# Patient Record
Sex: Female | Born: 1990 | Race: White | Hispanic: No | Marital: Married | Smoking: Former smoker
Health system: Southern US, Community
[De-identification: ages and names within clinical notes are randomized; demographics above are authoritative.]

## PROBLEM LIST (undated history)

## (undated) DIAGNOSIS — E7800 Pure hypercholesterolemia, unspecified: Secondary | ICD-10-CM

## (undated) DIAGNOSIS — I10 Essential (primary) hypertension: Secondary | ICD-10-CM

## (undated) DIAGNOSIS — C55 Malignant neoplasm of uterus, part unspecified: Secondary | ICD-10-CM

## (undated) DIAGNOSIS — F840 Autistic disorder: Secondary | ICD-10-CM

## (undated) DIAGNOSIS — E119 Type 2 diabetes mellitus without complications: Secondary | ICD-10-CM

## (undated) HISTORY — PX: ABDOMINAL HYSTERECTOMY: SHX81

---

## 2012-04-02 ENCOUNTER — Emergency Department (HOSPITAL_BASED_OUTPATIENT_CLINIC_OR_DEPARTMENT_OTHER)
Admission: EM | Admit: 2012-04-02 | Discharge: 2012-04-03 | Disposition: A | Discharge status: Home / Self Care | Payer: Self-pay | Attending: Emergency Medicine | Admitting: Emergency Medicine

## 2012-04-02 ENCOUNTER — Encounter (HOSPITAL_BASED_OUTPATIENT_CLINIC_OR_DEPARTMENT_OTHER): Payer: Self-pay | Admitting: *Deleted

## 2012-04-02 DIAGNOSIS — R52 Pain, unspecified: Secondary | ICD-10-CM | POA: Insufficient documentation

## 2012-04-02 DIAGNOSIS — R0602 Shortness of breath: Secondary | ICD-10-CM | POA: Insufficient documentation

## 2012-04-02 DIAGNOSIS — J3489 Other specified disorders of nose and nasal sinuses: Secondary | ICD-10-CM | POA: Insufficient documentation

## 2012-04-02 DIAGNOSIS — I10 Essential (primary) hypertension: Secondary | ICD-10-CM | POA: Insufficient documentation

## 2012-04-02 DIAGNOSIS — R062 Wheezing: Secondary | ICD-10-CM | POA: Insufficient documentation

## 2012-04-02 DIAGNOSIS — J40 Bronchitis, not specified as acute or chronic: Secondary | ICD-10-CM | POA: Insufficient documentation

## 2012-04-02 DIAGNOSIS — Z79899 Other long term (current) drug therapy: Secondary | ICD-10-CM | POA: Insufficient documentation

## 2012-04-02 DIAGNOSIS — IMO0001 Reserved for inherently not codable concepts without codable children: Secondary | ICD-10-CM | POA: Insufficient documentation

## 2012-04-02 HISTORY — DX: Essential (primary) hypertension: I10

## 2012-04-02 MED ORDER — ALBUTEROL SULFATE HFA 108 (90 BASE) MCG/ACT IN AERS
2.0000 | INHALATION_SPRAY | RESPIRATORY_TRACT | Status: DC | PRN
  Administered 2012-04-03: 2 via RESPIRATORY_TRACT
  Filled 2012-04-02: qty 6.7

## 2012-04-02 NOTE — ED Notes (Signed)
Flu like symptoms. Coughing, sneezing, congestion, aching

## 2012-04-02 NOTE — ED Provider Notes (Signed)
History     CSN: [id number]  Arrival date & time 04/02/12  2136   First MD Initiated Contact with Patient 04/02/12 2341      Chief Complaint  Patient presents with  . Influenza    (Consider location/radiation/quality/duration/timing/severity/associated sxs/prior treatment) HPI This is a 22 year old patient with a one-week history of flulike symptoms. Specifically she complains of cough, shortness of breath, wheezing, nasal congestion, body aches and malaise. This person is here this evening due to persistence of symptoms. There is no current fever. Although this person is used an inhaler in the past now has been available for treatment during this illness.  Past Medical History  Diagnosis Date  . Hypertension     History reviewed. No pertinent past surgical history.  No family history on file.  History  Substance Use Topics  . Smoking status: Never Smoker   . Smokeless tobacco: Not on file  . Alcohol Use: No      Review of Systems  All other systems reviewed and are negative.    Allergies  Review of patient's allergies indicates no known allergies.  Home Medications   Current Outpatient Rx  Name  Route  Sig  Dispense  Refill  . ARIPiprazole (ABILIFY) 10 MG tablet   Oral   Take 10 mg by mouth daily.         . citalopram (CELEXA) 20 MG tablet   Oral   Take 20 mg by mouth daily.         . enalapril (VASOTEC) 5 MG tablet   Oral   Take 5 mg by mouth daily.           BP 157/86  Pulse 105  Temp(Src) 98 F (36.7 C) (Oral)  Resp 20  Ht 5\' 2"  (1.575 m)  Wt 290 lb (131.543 kg)  BMI 53.03 kg/m2  SpO2 98%  Physical Exam General: Well-developed, obese patient in no acute distress; appearance consistent with age of record HENT: normocephalic, atraumatic; no pharyngeal erythema or exudate Eyes: pupils equal round and reactive to light; extraocular muscles intact Neck: supple Heart: regular rate and rhythm Lungs: clear to auscultation  bilaterally Abdomen: soft; obese; bowel sounds present Extremities: No deformity; full range of motion Neurologic: Awake, alert and oriented; motor function intact in all extremities and symmetric; no facial droop Skin: Warm and dry Psychiatric: Normal mood and affect    ED Course  Procedures (including critical care time)     MDM  Nursing notes and vitals signs, including pulse oximetry, reviewed.  Summary of this visit's results, reviewed by myself:  Imaging Studies: Dg Chest 2 View  04/03/2012  *RADIOLOGY REPORT*  Clinical Data: Cough, congestion  CHEST - 2 VIEW  Comparison: None.  Findings: Lungs are clear. No pleural effusion or pneumothorax. The cardiomediastinal contours are within normal limits. The visualized bones and soft tissues are without significant appreciable abnormality.  IMPRESSION: No radiographic evidence of acute cardiopulmonary process.   Original Report Authenticated By: Jearld Lesch, M.D.             Hanley Seamen, MD 04/03/12 [phone]

## 2012-04-03 ENCOUNTER — Emergency Department (HOSPITAL_BASED_OUTPATIENT_CLINIC_OR_DEPARTMENT_OTHER): Payer: Self-pay

## 2012-04-03 MED ORDER — ONDANSETRON 8 MG PO TBDP
8.0000 mg | ORAL_TABLET | Freq: Once | ORAL | Status: AC
  Administered 2012-04-03: 8 mg via ORAL
  Filled 2012-04-03: qty 1

## 2012-04-03 MED ORDER — HYDROCOD POLST-CHLORPHEN POLST 10-8 MG/5ML PO LQCR: 5.0000 mL | Freq: Two times a day (BID) | ORAL | Status: DC | PRN

## 2012-04-03 NOTE — ED Notes (Signed)
Patient transported to X-ray 

## 2012-04-03 NOTE — Patient Instructions (Signed)
Instructed pt on the proper use of administering albuteral mdi via aerochamber pt tolerated well 

## 2012-04-18 ENCOUNTER — Encounter (HOSPITAL_BASED_OUTPATIENT_CLINIC_OR_DEPARTMENT_OTHER): Payer: Self-pay | Admitting: Emergency Medicine

## 2012-04-18 ENCOUNTER — Emergency Department (HOSPITAL_BASED_OUTPATIENT_CLINIC_OR_DEPARTMENT_OTHER): Payer: Self-pay

## 2012-04-18 ENCOUNTER — Emergency Department (HOSPITAL_BASED_OUTPATIENT_CLINIC_OR_DEPARTMENT_OTHER)
Admission: EM | Admit: 2012-04-18 | Discharge: 2012-04-18 | Disposition: A | Discharge status: Home / Self Care | Payer: Self-pay | Attending: Emergency Medicine | Admitting: Emergency Medicine

## 2012-04-18 DIAGNOSIS — I10 Essential (primary) hypertension: Secondary | ICD-10-CM | POA: Insufficient documentation

## 2012-04-18 DIAGNOSIS — R0789 Other chest pain: Secondary | ICD-10-CM | POA: Insufficient documentation

## 2012-04-18 DIAGNOSIS — R42 Dizziness and giddiness: Secondary | ICD-10-CM | POA: Insufficient documentation

## 2012-04-18 DIAGNOSIS — Z79899 Other long term (current) drug therapy: Secondary | ICD-10-CM | POA: Insufficient documentation

## 2012-04-18 NOTE — ED Provider Notes (Signed)
History     CSN: [id number]  Arrival date & time 04/18/12  0258   First MD Initiated Contact with Patient 04/18/12 0310      Chief Complaint  Patient presents with  . Dizziness  . Chest Pain    (Consider location/radiation/quality/duration/timing/severity/associated sxs/prior treatment) HPI Comments: Patient is biologically a female who requests to be referred to as a female.  Presents tonight with a several hour history of feeling dizzy, having chest pains.  The significant other in the room with the patient reports that the blood sugar was 105 when it is "normally 70 this time of night."  Patient has no history of diabetes.  No fevers or chills.  No cough.    Patient is a 22 y.o. female presenting with chest pain. The history is provided by the patient.  Chest Pain   Past Medical History  Diagnosis Date  . Hypertension     History reviewed. No pertinent past surgical history.  No family history on file.  History  Substance Use Topics  . Smoking status: Never Smoker   . Smokeless tobacco: Not on file  . Alcohol Use: No      Review of Systems  Cardiovascular: Positive for chest pain.  All other systems reviewed and are negative.    Allergies  Review of patient's allergies indicates no known allergies.  Home Medications   Current Outpatient Rx  Name  Route  Sig  Dispense  Refill  . albuterol (PROVENTIL HFA;VENTOLIN HFA) 108 (90 BASE) MCG/ACT inhaler   Inhalation   Inhale 2 puffs into the lungs every 6 (six) hours as needed for wheezing.         . naproxen (NAPROSYN) 500 MG tablet   Oral   Take 500 mg by mouth as needed.         . ARIPiprazole (ABILIFY) 10 MG tablet   Oral   Take 10 mg by mouth daily.         . chlorpheniramine-HYDROcodone (TUSSIONEX PENNKINETIC ER) 10-8 MG/5ML LQCR   Oral   Take 5 mLs by mouth every 12 (twelve) hours as needed (for cough).   115 mL   0   . citalopram (CELEXA) 20 MG tablet   Oral   Take 20 mg by mouth daily.        . enalapril (VASOTEC) 5 MG tablet   Oral   Take 5 mg by mouth daily.           BP 154/80  Pulse 85  Temp(Src) 97.8 F (36.6 C) (Oral)  Resp 18  Ht 5\' 2"  (1.575 m)  Wt 290 lb (131.543 kg)  BMI 53.03 kg/m2  SpO2 99%  Physical Exam  Nursing note and vitals reviewed. Constitutional: He appears well-developed and well-nourished. No distress.  Awake, alert, nontoxic appearance.  HENT:  Head: Normocephalic and atraumatic.  Mouth/Throat: Oropharynx is clear and moist.  Eyes: Pupils are equal, round, and reactive to light.  Neck: Normal range of motion. Neck supple.  Cardiovascular: Normal rate and regular rhythm.   No murmur heard. Pulmonary/Chest: Effort normal and breath sounds normal. No respiratory distress. He has no wheezes.  Abdominal: Soft. Bowel sounds are normal. There is no tenderness. There is no rebound.  Musculoskeletal: Normal range of motion. He exhibits no tenderness.  Baseline ROM, no obvious new focal weakness.  Lymphadenopathy:    He has no cervical adenopathy.  Neurological: He is alert.  Mental status and motor strength appears baseline for patient and situation.  Skin: Skin is warm and dry. No rash noted. He is not diaphoretic.  Psychiatric: He has a normal mood and affect.    ED Course  Procedures (including critical care time)  Labs Reviewed  CBC WITH DIFFERENTIAL  BASIC METABOLIC PANEL   No results found.   No diagnosis found.   Date: 04/18/2012  Rate: 87  Rhythm: normal sinus rhythm  QRS Axis: normal  Intervals: normal  ST/T Wave abnormalities: normal  Conduction Disutrbances:none  Narrative Interpretation:   Old EKG Reviewed: none available    MDM  The labs are unremarkable with the exception of an elevated wbc of 15k, the significance of which I am unsure.  I suspect this is likely a viral illness as there are no specific complaints except for chest discomfort.  The ekg is unremarkable and no further workup seems  appropriate at this time.  I have recommend watching the blood sugar at home.  Follow up with pcp if not improving, return prn.        Geoffery Lyons, MD 04/18/12 0430

## 2012-04-18 NOTE — ED Notes (Signed)
Pt c/o fever, dizziness, chest pain. Pt had URI x 1-2 weeks ago.

## 2012-04-18 NOTE — ED Notes (Signed)
Pt has not used inhaler tonight.

## 2012-04-18 NOTE — ED Notes (Signed)
Patient transported to X-ray 

## 2012-04-18 NOTE — ED Notes (Signed)
MD at bedside. 

## 2012-04-23 ENCOUNTER — Encounter (HOSPITAL_BASED_OUTPATIENT_CLINIC_OR_DEPARTMENT_OTHER): Payer: Self-pay | Admitting: *Deleted

## 2012-04-23 ENCOUNTER — Emergency Department (HOSPITAL_BASED_OUTPATIENT_CLINIC_OR_DEPARTMENT_OTHER)
Admission: EM | Admit: 2012-04-23 | Discharge: 2012-04-23 | Disposition: A | Discharge status: Home / Self Care | Payer: Self-pay | Attending: Emergency Medicine | Admitting: Emergency Medicine

## 2012-04-23 DIAGNOSIS — I10 Essential (primary) hypertension: Secondary | ICD-10-CM | POA: Insufficient documentation

## 2012-04-23 DIAGNOSIS — N644 Mastodynia: Secondary | ICD-10-CM

## 2012-04-23 DIAGNOSIS — Z79899 Other long term (current) drug therapy: Secondary | ICD-10-CM | POA: Insufficient documentation

## 2012-04-23 LAB — CBC WITH DIFFERENTIAL/PLATELET
Basophils Absolute: 0 10*3/uL (ref 0.0–0.1)
Basophils Relative: 0 % (ref 0–1)
Eosinophils Absolute: 0.2 10*3/uL (ref 0.0–0.7)
MCH: 30.8 pg (ref 26.0–34.0)
MCHC: 33.4 g/dL (ref 30.0–36.0)
Monocytes Absolute: 1.1 10*3/uL — ABNORMAL HIGH (ref 0.1–1.0)
Monocytes Relative: 7 % (ref 3–12)
Neutro Abs: 10.9 10*3/uL — ABNORMAL HIGH (ref 1.7–7.7)
Neutrophils Relative %: 71 % (ref 43–77)
RDW: 12.8 % (ref 11.5–15.5)

## 2012-04-23 LAB — BASIC METABOLIC PANEL
BUN: 15 mg/dL (ref 6–23)
Chloride: 105 mEq/L (ref 96–112)
Creatinine, Ser: 0.7 mg/dL (ref 0.50–1.10)
GFR calc Af Amer: >90 mL/min (ref >90)
GFR calc non Af Amer: >90 mL/min (ref >90)
Potassium: 3.9 mEq/L (ref 3.5–5.1)

## 2012-04-23 NOTE — ED Provider Notes (Signed)
Medical screening examination/treatment/procedure(s) were performed by non-physician practitioner and as supervising physician I was immediately available for consultation/collaboration.  Ethelda Chick, MD 04/23/12 2155

## 2012-04-23 NOTE — ED Provider Notes (Signed)
History     CSN: [id number]  Arrival date & time 04/23/12  2033   First MD Initiated Contact with Patient 04/23/12 2125      Chief Complaint  Patient presents with  . Breast Problem    (Consider location/radiation/quality/duration/timing/severity/associated sxs/prior treatment) HPI Comments: Pt complains of a swollen tender area left breast.  Pt reports she has family members who have breast cancer and is worried. No fever, no chills,  No redness    No other problems.  Taking naproxen for pain  The history is provided by the patient. No language interpreter was used.    Past Medical History  Diagnosis Date  . Hypertension     History reviewed. No pertinent past surgical history.  History reviewed. No pertinent family history.  History  Substance Use Topics  . Smoking status: Never Smoker   . Smokeless tobacco: Not on file  . Alcohol Use: No    OB History   Grav Para Term Preterm Abortions TAB SAB Ect Mult Living                  Review of Systems  All other systems reviewed and are negative.    Allergies  Review of patient's allergies indicates no known allergies.  Home Medications   Current Outpatient Rx  Name  Route  Sig  Dispense  Refill  . albuterol (PROVENTIL HFA;VENTOLIN HFA) 108 (90 BASE) MCG/ACT inhaler   Inhalation   Inhale 2 puffs into the lungs every 6 (six) hours as needed for wheezing.         . ARIPiprazole (ABILIFY) 10 MG tablet   Oral   Take 10 mg by mouth daily.         . chlorpheniramine-HYDROcodone (TUSSIONEX PENNKINETIC ER) 10-8 MG/5ML LQCR   Oral   Take 5 mLs by mouth every 12 (twelve) hours as needed (for cough).   115 mL   0   . citalopram (CELEXA) 20 MG tablet   Oral   Take 20 mg by mouth daily.         . enalapril (VASOTEC) 5 MG tablet   Oral   Take 5 mg by mouth daily.         . naproxen (NAPROSYN) 500 MG tablet   Oral   Take 500 mg by mouth as needed.           BP 143/67  Pulse 102  Temp(Src) 98 F  (36.7 C) (Oral)  Resp 20  Ht 5\' 2"  (1.575 m)  Wt 290 lb (131.543 kg)  BMI 53.03 kg/m2  SpO2 98%  Physical Exam  Nursing note and vitals reviewed. Constitutional: She appears well-developed and well-nourished.  HENT:  Head: Normocephalic.  Cardiovascular: Normal rate and regular rhythm.   Pulmonary/Chest: Effort normal. She exhibits tenderness.  Tender areas left breast,  I do not feel a mass but there is a tender area with increased fullness  Musculoskeletal: Normal range of motion.  Neurological: She is alert.  Skin: Skin is warm.  Psychiatric: She has a normal mood and affect.    ED Course  Procedures (including critical care time)  Labs Reviewed - No data to display No results found.   1. Breast pain       MDM  Breast center phone number given and adult care at Urgent care number        Elson Areas, PA-C 04/23/12 2154

## 2012-04-23 NOTE — ED Notes (Signed)
Pt c/o left breast nodule x 1 week

## 2012-04-24 ENCOUNTER — Encounter (HOSPITAL_COMMUNITY): Payer: Self-pay | Admitting: Emergency Medicine

## 2012-04-24 ENCOUNTER — Emergency Department (HOSPITAL_COMMUNITY)
Admission: EM | Admit: 2012-04-24 | Discharge: 2012-04-24 | Disposition: A | Discharge status: Home / Self Care | Payer: Self-pay | Attending: Emergency Medicine | Admitting: Emergency Medicine

## 2012-04-24 DIAGNOSIS — I10 Essential (primary) hypertension: Secondary | ICD-10-CM | POA: Insufficient documentation

## 2012-04-24 DIAGNOSIS — R509 Fever, unspecified: Secondary | ICD-10-CM | POA: Insufficient documentation

## 2012-04-24 DIAGNOSIS — E669 Obesity, unspecified: Secondary | ICD-10-CM | POA: Insufficient documentation

## 2012-04-24 DIAGNOSIS — N644 Mastodynia: Secondary | ICD-10-CM | POA: Insufficient documentation

## 2012-04-24 DIAGNOSIS — Z79899 Other long term (current) drug therapy: Secondary | ICD-10-CM | POA: Insufficient documentation

## 2012-04-24 DIAGNOSIS — N63 Unspecified lump in unspecified breast: Secondary | ICD-10-CM | POA: Insufficient documentation

## 2012-04-24 MED ORDER — TRAMADOL HCL 50 MG PO TABS: 50.0000 mg | ORAL_TABLET | Freq: Four times a day (QID) | ORAL | Status: DC | PRN

## 2012-04-24 MED ORDER — ONDANSETRON 8 MG PO TBDP
8.0000 mg | ORAL_TABLET | Freq: Once | ORAL | Status: AC
  Administered 2012-04-24: 8 mg via ORAL
  Filled 2012-04-24: qty 1

## 2012-04-24 MED ORDER — HYDROCODONE-ACETAMINOPHEN 5-325 MG PO TABS
2.0000 | ORAL_TABLET | Freq: Once | ORAL | Status: AC
  Administered 2012-04-24: 2 via ORAL
  Filled 2012-04-24: qty 2

## 2012-04-24 NOTE — ED Provider Notes (Signed)
Medical screening examination/treatment/procedure(s) were performed by non-physician practitioner and as supervising physician I was immediately available for consultation/collaboration.   Rahshawn Remo Y. Loi Rennaker, MD 04/24/12 2214 

## 2012-04-24 NOTE — ED Provider Notes (Signed)
History    This chart was scribed for non-physician practitioner working with Krista Williams. Krista Lamas, MD by Krista Williams, ED Scribe. This patient was seen in room WTR6/WTR6 and the patient's care was started at 6:59PM.   CSN: [id number]  Arrival date & time 04/24/12  1191   First MD Initiated Contact with Patient 04/24/12 1859      No chief complaint on file.   (Consider location/radiation/quality/duration/timing/severity/associated sxs/prior treatment) Patient is a 22 y.o. female presenting with general illness. The history is provided by a friend and the patient. No language interpreter was used.  Illness  The current episode started 5 to 7 days ago. The onset was gradual. The problem occurs frequently. The problem has been gradually worsening. The problem is moderate. Nothing relieves the symptoms. The symptoms are aggravated by movement. Associated symptoms include a fever ("always runs a fever"). Pertinent negatives include no abdominal pain, no diarrhea, no nausea, no vomiting and no muscle aches. She has been behaving normally.    Krista Williams is a 22 y.o. female , with a hx of hypertension, who presents to the Emergency Department with a chief complaint of breast pain, complaining of gradual, progressively worsening, breast pain located at the left breast, onset seven days ago (04/17/12).  Associated symptoms include fever and chills. The pt's friend reports she felt a lump within the pt's breast earlier this afternoon which has been causing the pt severe pain for the past seven days. Modifying factors include certain movements and positions which intensifies the breast pain.   The pt denies nausea, vomiting, diarrhea, and abdominal pain.   The pt does not smoke or drink alcohol.   She was seen in this ED yesterday and was told the lump she has was likely due to her menstrual cycle. The patient's wife was very upset and said that her testosterone was too high and that she has not had a  menstrual cycle since she was 13.   Past Medical History  Diagnosis Date  . Hypertension     History reviewed. No pertinent past surgical history.  No family history on file.  History  Substance Use Topics  . Smoking status: Never Smoker   . Smokeless tobacco: Not on file  . Alcohol Use: No    OB History   Grav Para Term Preterm Abortions TAB SAB Ect Mult Living                  Review of Systems  Constitutional: Positive for fever ("always runs a fever"). Negative for chills.  Gastrointestinal: Negative for nausea, vomiting, abdominal pain and diarrhea.  All other systems reviewed and are negative.    Allergies  Review of patient's allergies indicates no known allergies.  Home Medications   Current Outpatient Rx  Name  Route  Sig  Dispense  Refill  . albuterol (PROVENTIL HFA;VENTOLIN HFA) 108 (90 BASE) MCG/ACT inhaler   Inhalation   Inhale 2 puffs into the lungs every 6 (six) hours as needed for wheezing.         . ARIPiprazole (ABILIFY) 10 MG tablet   Oral   Take 10 mg by mouth daily.         . citalopram (CELEXA) 20 MG tablet   Oral   Take 20 mg by mouth daily.         . enalapril (VASOTEC) 5 MG tablet   Oral   Take 5 mg by mouth daily.         Marland Kitchen  naproxen (NAPROSYN) 500 MG tablet   Oral   Take 500 mg by mouth as needed (pain).            BP 126/72  Pulse 104  Temp(Src) 98 F (36.7 C) (Oral)  Resp 16  Wt 290 lb (131.543 kg)  BMI 53.03 kg/m2  SpO2 100%  Physical Exam  Nursing note and vitals reviewed. Constitutional: She is oriented to person, place, and time. Vital signs are normal. She appears well-developed and well-nourished. No distress.  obese  HENT:  Head: Normocephalic and atraumatic.  Right Ear: External ear normal.  Left Ear: External ear normal.  Nose: Nose normal.  Mouth/Throat: Oropharynx is clear and moist.  Eyes: Conjunctivae are normal.  Neck: Normal range of motion.  Cardiovascular: Normal rate, regular  rhythm and normal heart sounds.   Pulmonary/Chest: Effort normal and breath sounds normal. No stridor. No respiratory distress. She has no wheezes. She has no rales. She exhibits tenderness.  Tenderness at the medial aspect of the left breast over area of increased fullness. No fluctuence, no induration, no erythema on exam.   Abdominal: Soft. She exhibits no distension.  Genitourinary: There is breast tenderness. No breast swelling, discharge or bleeding.  Musculoskeletal: Normal range of motion.  Neurological: She is alert and oriented to person, place, and time. She has normal strength.  Skin: Skin is warm and dry. She is not diaphoretic. No erythema.  Psychiatric: She has a normal mood and affect. Her behavior is normal.    ED Course  Procedures (including critical care time)  DIAGNOSTIC STUDIES: Oxygen Saturation is 100% on room air, normal by my interpretation.    COORDINATION OF CARE:  7:09 PM- Treatment plan discussed with patient. Pt agrees with treatment.  7:15PM- Evaluation of left breast performed. Pt agrees with treatment. Findings agreeable with previous evaluations of the pt.      Labs Reviewed - No data to display No results found.   1. Breast pain       MDM  Patient presents today with continued breast pain and subjective swelling. She presented to this ED last night and was told she needs to follow up at the [facility]. Patient's wife states she spent all morning calling office's on the resource guide. They are on waiting list for scholarship program at breast center. Encouraged to continue to call facilities on resource guide to establish care with PCP. Tender to palpation with fullness. No erythema, mass, dimpling, discharge, fluctuance, or induration. No imaging indicated at this time. Tramadol given for pain. Agreeable to plan. Understand the importance of outpatient follow up outside the emergency department. Patient / Family / Caregiver informed of  clinical course, understand medical decision-making process, and agree with plan.    I personally performed the services described in this documentation, which was scribed in my presence. The recorded information has been reviewed and is accurate.   Krista Williams Bellman, PA-C 04/24/12 2033

## 2012-04-24 NOTE — ED Notes (Signed)
Patient with pain in her left breast for one week.  No trauma reported.  Slight bruise to upper area.  No discharge from nipple.

## 2012-11-25 ENCOUNTER — Encounter (HOSPITAL_COMMUNITY): Payer: Self-pay | Admitting: Emergency Medicine

## 2012-11-25 ENCOUNTER — Emergency Department (HOSPITAL_COMMUNITY)
Admission: EM | Admit: 2012-11-25 | Discharge: 2012-11-25 | Disposition: A | Discharge status: Home / Self Care | Payer: Self-pay | Attending: Emergency Medicine | Admitting: Emergency Medicine

## 2012-11-25 ENCOUNTER — Emergency Department (HOSPITAL_COMMUNITY): Payer: Self-pay

## 2012-11-25 DIAGNOSIS — IMO0001 Reserved for inherently not codable concepts without codable children: Secondary | ICD-10-CM | POA: Insufficient documentation

## 2012-11-25 DIAGNOSIS — I10 Essential (primary) hypertension: Secondary | ICD-10-CM | POA: Insufficient documentation

## 2012-11-25 DIAGNOSIS — R42 Dizziness and giddiness: Secondary | ICD-10-CM | POA: Insufficient documentation

## 2012-11-25 DIAGNOSIS — Z79899 Other long term (current) drug therapy: Secondary | ICD-10-CM | POA: Insufficient documentation

## 2012-11-25 DIAGNOSIS — R51 Headache: Secondary | ICD-10-CM | POA: Insufficient documentation

## 2012-11-25 DIAGNOSIS — H538 Other visual disturbances: Secondary | ICD-10-CM | POA: Insufficient documentation

## 2012-11-25 HISTORY — DX: Type 2 diabetes mellitus without complications: E11.9

## 2012-11-25 LAB — URINALYSIS, ROUTINE W REFLEX MICROSCOPIC
Leukocytes, UA: NEGATIVE
Nitrite: NEGATIVE
Specific Gravity, Urine: 1.035 — ABNORMAL HIGH (ref 1.005–1.030)
Urobilinogen, UA: 0.2 mg/dL (ref 0.0–1.0)
pH: 6 (ref 5.0–8.0)

## 2012-11-25 LAB — CBC
HCT: 38.6 % (ref 36.0–46.0)
MCHC: 33.4 g/dL (ref 30.0–36.0)
RDW: 12.7 % (ref 11.5–15.5)
WBC: 12.1 10*3/uL — ABNORMAL HIGH (ref 4.0–10.5)

## 2012-11-25 LAB — BASIC METABOLIC PANEL
BUN: 12 mg/dL (ref 6–23)
Chloride: 99 mEq/L (ref 96–112)
GFR calc Af Amer: >90 mL/min (ref >90)
GFR calc non Af Amer: >90 mL/min (ref >90)
Potassium: 3.9 mEq/L (ref 3.5–5.1)
Sodium: 136 mEq/L (ref 135–145)

## 2012-11-25 LAB — GLUCOSE, CAPILLARY: Glucose-Capillary: 171 mg/dL — ABNORMAL HIGH (ref 70–99)

## 2012-11-25 MED ORDER — METOCLOPRAMIDE HCL 5 MG/ML IJ SOLN
10.0000 mg | Freq: Once | INTRAMUSCULAR | Status: AC
  Administered 2012-11-25: 10 mg via INTRAMUSCULAR
  Filled 2012-11-25: qty 2

## 2012-11-25 MED ORDER — KETOROLAC TROMETHAMINE 30 MG/ML IJ SOLN
30.0000 mg | Freq: Once | INTRAMUSCULAR | Status: AC
  Administered 2012-11-25: 30 mg via INTRAVENOUS
  Filled 2012-11-25: qty 1

## 2012-11-25 MED ORDER — SODIUM CHLORIDE 0.9 % IV BOLUS (SEPSIS)
1000.0000 mL | Freq: Once | INTRAVENOUS | Status: AC
  Administered 2012-11-25: 1000 mL via INTRAVENOUS

## 2012-11-25 MED ORDER — DIPHENHYDRAMINE HCL 50 MG/ML IJ SOLN
25.0000 mg | Freq: Once | INTRAMUSCULAR | Status: AC
  Administered 2012-11-25: 25 mg via INTRAVENOUS
  Filled 2012-11-25: qty 1

## 2012-11-25 NOTE — ED Notes (Signed)
Pt states she has had headaches,  And dizziness for past few weeks and feels like she is going to pass out.  Pt is alert and oriented in NAD

## 2012-11-25 NOTE — ED Notes (Signed)
Pt states for the past 2 weeks has been having headaches everyday for the past two weeks Pt also c/o dizziness and shakiness  Person in room with pt states that pt c/o dizziness and states that pt feels like they are going to pass out

## 2012-11-25 NOTE — ED Provider Notes (Signed)
CSN: [id number]     Arrival date & time 11/25/12  1951 History   First MD Initiated Contact with Patient 11/25/12 2012     Chief Complaint  Patient presents with  . Headache  . Dizziness   (Consider location/radiation/quality/duration/timing/severity/associated sxs/prior Treatment) Patient is a 22 y.o. female presenting with headaches. The history is provided by the patient and medical records. No language interpreter was used.  Headache Associated symptoms: no abdominal pain, no back pain, no cough, no diarrhea, no fatigue, no fever, no nausea, no neck stiffness and no vomiting     Krista Williams is a 22 y.o. female  with a hx of hypertension, diabetes (uncontrolled) presents to the Emergency Department complaining of gradual, waxing and waning, progressively worsening generalized headache onset greater than 2 weeks ago. Associated symptoms include blurred vision which worsened significantly today but is currently not present and lightheadedness without syncope.  Patient has been taking ibuprofen and naproxen without relief..  Nothing makes it better and lights and loud noises makes it worse.  Pt denies fever, chills, neck pain, nuchal rigidity, chest pain, shortness of breath, abdominal pain, nausea, vomiting, diarrhea, weakness, dizziness, syncope, dysuria, hematuria.  Patient denies history of headaches.   Past Medical History  Diagnosis Date  . Hypertension   . Diabetes mellitus without complication    History reviewed. No pertinent past surgical history. Family History  Problem Relation Age of Onset  . Hypertension Father   . Cancer Other   . Diabetes Other    History  Substance Use Topics  . Smoking status: Never Smoker   . Smokeless tobacco: Not on file  . Alcohol Use: No   OB History   Grav Para Term Preterm Abortions TAB SAB Ect Mult Living                 Review of Systems  Constitutional: Negative for fever, diaphoresis, appetite change, fatigue and unexpected  weight change.  HENT: Negative for mouth sores.   Eyes: Positive for visual disturbance.  Respiratory: Negative for cough, chest tightness, shortness of breath and wheezing.   Cardiovascular: Negative for chest pain.  Gastrointestinal: Negative for nausea, vomiting, abdominal pain, diarrhea and constipation.  Endocrine: Negative for polydipsia, polyphagia and polyuria.  Genitourinary: Negative for dysuria, urgency, frequency and hematuria.  Musculoskeletal: Negative for back pain and neck stiffness.  Skin: Negative for rash.  Allergic/Immunologic: Negative for immunocompromised state.  Neurological: Positive for headaches. Negative for syncope and light-headedness.  Hematological: Does not bruise/bleed easily.  Psychiatric/Behavioral: Negative for sleep disturbance. The patient is not nervous/anxious.     Allergies  Review of patient's allergies indicates no known allergies.  Home Medications   Current Outpatient Rx  Name  Route  Sig  Dispense  Refill  . citalopram (CELEXA) 20 MG tablet   Oral   Take 20 mg by mouth daily.         . enalapril (VASOTEC) 5 MG tablet   Oral   Take 5 mg by mouth daily.         Marland Kitchen OVER THE COUNTER MEDICATION   Oral   Take 1 tablet by mouth as needed (pain relief/fever reduction.). Over the counter pain reliever/fever reducer.          BP 145/83  Pulse 86  Temp(Src) 98.6 F (37 C) (Oral)  Resp 20  Ht 5\' 2"  (1.575 m)  Wt 309 lb 6.4 oz (140.343 kg)  BMI 56.58 kg/m2  SpO2 98% Physical Exam  Nursing note and  vitals reviewed. Constitutional: She is oriented to person, place, and time. She appears well-developed and well-nourished. No distress.  HENT:  Head: Normocephalic and atraumatic.  Mouth/Throat: Oropharynx is clear and moist.  Eyes: Conjunctivae and EOM are normal. Pupils are equal, round, and reactive to light. No scleral icterus.  Neck: Normal range of motion. Neck supple.  Cardiovascular: Normal rate, regular rhythm, normal  heart sounds and intact distal pulses.   No murmur heard. Pulmonary/Chest: Effort normal and breath sounds normal. No respiratory distress. She has no wheezes. She has no rales.  Abdominal: Soft. Bowel sounds are normal. There is no tenderness. There is no rebound and no guarding.  Musculoskeletal: Normal range of motion.  Lymphadenopathy:    She has no cervical adenopathy.  Neurological: She is alert and oriented to person, place, and time. She has normal reflexes. No cranial nerve deficit. She exhibits normal muscle tone. Coordination normal.  Speech is clear and goal oriented, follows commands Cranial nerves III - XII without deficit, no facial droop Normal strength in upper and lower extremities bilaterally, strong and equal grip strength Sensation normal to light and sharp touch Moves extremities without ataxia, coordination intact Normal finger to nose and rapid alternating movements Neg romberg, no pronator drift Normal gait Normal heel-shin and balance   Skin: Skin is warm and dry. No rash noted. She is not diaphoretic.  Psychiatric: She has a normal mood and affect. Her behavior is normal. Judgment and thought content normal.    ED Course  Procedures (including critical care time) Labs Review Labs Reviewed  CBC - Abnormal; Notable for the following:    WBC 12.1 (*)    All other components within normal limits  BASIC METABOLIC PANEL - Abnormal; Notable for the following:    Glucose, Bld 182 (*)    All other components within normal limits  URINALYSIS, ROUTINE W REFLEX MICROSCOPIC - Abnormal; Notable for the following:    APPearance CLOUDY (*)    Specific Gravity, Urine 1.035 (*)    All other components within normal limits  GLUCOSE, CAPILLARY - Abnormal; Notable for the following:    Glucose-Capillary 171 (*)    All other components within normal limits   Imaging Review Ct Head Wo Contrast  11/25/2012   CLINICAL DATA:  Headache with dizziness  EXAM: CT HEAD WITHOUT  CONTRAST  TECHNIQUE: Contiguous axial images were obtained from the base of the skull through the vertex without intravenous contrast.  COMPARISON:  None available  FINDINGS: No acute intracranial hemorrhage or infarct. No mass or midline shift. CSF containing spaces are normal without evidence of hydrocephalus. Gray-white matter differentiation is well maintained. No extra-axial fluid collection. Calvarium is intact. Orbits are within normal limits.  Paranasal sinuses are clear. There is partial opacification of the right mastoid air cells, consistent with mastoid effusion. The left mastoid air cells are clear.  IMPRESSION: 1. Normal head CT with no acute intracranial process. 2. Small right mastoid effusion.   Electronically Signed   By: Rise Mu M.D.   On: 11/25/2012 21:08    EKG Interpretation   None       MDM   1. Headache      Krista Williams presents with 2 weeks of headache.  Patient without history of migraine and also endorses change in vision.  Will obtain basic labs, CT head and treat symptoms.  11:15PM Mildly elevated white blood cell count 12.1, CMP unremarkable, glucose 171, UA without evidence of urinary tract infection but evidence of  mild dehydration with increased specific gravity.  Normal head CT without acute intracranial process.  I personally reviewed the imaging tests through PACS system.  I reviewed available ER/hospitalization records through the EMR.    Patient treated with Toradol, Benadryl and Reglan with significant improvement in pain and resolution of visual disturbance. Patient also with resolution of lightheaded feeling. [facility] for further discussion of her borderline diabetes and further evaluation of her headache.  Patient neurovascularly intact with normal neurologic exam. Patient ambulatory without difficulty, no sensory deficits. Patient without nausea, vomiting, abdominal pain.  It has been determined  that no acute conditions requiring further emergency intervention are present at this time. The patient/guardian have been advised of the diagnosis and plan. We have discussed signs and symptoms that warrant return to the ED, such as changes or worsening in symptoms.   Vital signs are stable at discharge.   BP 145/83  Pulse 86  Temp(Src) 98.6 F (37 C) (Oral)  Resp 20  Ht 5\' 2"  (1.575 m)  Wt 309 lb 6.4 oz (140.343 kg)  BMI 56.58 kg/m2  SpO2 98%  Patient/guardian has voiced understanding and agreed to follow-up with the PCP or specialist.           Dierdre Forth, PA-C 11/25/12 2348  Dierdre Forth, PA-C 11/25/12 2348

## 2012-11-29 NOTE — ED Provider Notes (Signed)
Medical screening examination/treatment/procedure(s) were performed by non-physician practitioner and as supervising physician I was immediately available for consultation/collaboration.  EKG Interpretation   None         Fatima Fedie M Reyden Smith, MD 11/29/12 0718 

## 2012-12-01 ENCOUNTER — Emergency Department (HOSPITAL_COMMUNITY)
Admission: EM | Admit: 2012-12-01 | Discharge: 2012-12-02 | Disposition: A | Discharge status: Home / Self Care | Payer: Self-pay | Attending: Emergency Medicine | Admitting: Emergency Medicine

## 2012-12-01 ENCOUNTER — Emergency Department (HOSPITAL_COMMUNITY): Payer: Self-pay

## 2012-12-01 ENCOUNTER — Encounter (HOSPITAL_COMMUNITY): Payer: Self-pay | Admitting: Emergency Medicine

## 2012-12-01 DIAGNOSIS — M25519 Pain in unspecified shoulder: Secondary | ICD-10-CM | POA: Insufficient documentation

## 2012-12-01 DIAGNOSIS — E119 Type 2 diabetes mellitus without complications: Secondary | ICD-10-CM | POA: Insufficient documentation

## 2012-12-01 DIAGNOSIS — Z79899 Other long term (current) drug therapy: Secondary | ICD-10-CM | POA: Insufficient documentation

## 2012-12-01 DIAGNOSIS — M25569 Pain in unspecified knee: Secondary | ICD-10-CM | POA: Insufficient documentation

## 2012-12-01 DIAGNOSIS — I10 Essential (primary) hypertension: Secondary | ICD-10-CM | POA: Insufficient documentation

## 2012-12-01 DIAGNOSIS — M25562 Pain in left knee: Secondary | ICD-10-CM

## 2012-12-01 NOTE — ED Provider Notes (Signed)
CSN: [id number]     Arrival date & time 12/01/12  2301 History   First MD Initiated Contact with Patient 12/01/12 2306    This chart was scribed for Antony Madura PA-C, a non-physician practitioner working with No att. providers found by Lewanda Rife, ED Scribe. This patient was seen in room WTR9/WTR9 and the patient's care was started at 8:27 PM    Chief Complaint  Patient presents with  . Knee Pain   (Consider location/radiation/quality/duration/timing/severity/associated sxs/prior Treatment) The history is provided by the patient. No language interpreter was used.   HPI Comments: Krista Williams is a 22 y.o. female who presents to the Emergency Department with PMHx of HTN, and DM complaining of constant moderate left knee pain radiating down toes onset 5 days. Describes pain as sharp. Denies associated recent injuries, fall, Reports pain is exacerbated with weight bearing and touch. Denies any alleviating factors. Reports trying naproxen with no relief of symptoms.   Past Medical History  Diagnosis Date  . Hypertension   . Diabetes mellitus without complication    History reviewed. No pertinent past surgical history. Family History  Problem Relation Age of Onset  . Hypertension Father   . Cancer Other   . Diabetes Other    History  Substance Use Topics  . Smoking status: Never Smoker   . Smokeless tobacco: Not on file  . Alcohol Use: No   OB History   Grav Para Term Preterm Abortions TAB SAB Ect Mult Living                 Review of Systems  Constitutional: Negative for fever.  Musculoskeletal:       Right shoulder pain   All other systems reviewed and are negative.  A complete 10 system review of systems was obtained and all systems are negative except as noted in the HPI and PMHx.    Allergies  Review of patient's allergies indicates no known allergies.  Home Medications   Current Outpatient Rx  Name  Route  Sig  Dispense  Refill  . citalopram (CELEXA)  20 MG tablet   Oral   Take 20 mg by mouth daily.         . enalapril (VASOTEC) 5 MG tablet   Oral   Take 5 mg by mouth daily.         . naproxen sodium (ANAPROX) 220 MG tablet   Oral   Take 220 mg by mouth as needed (pain).         Marland Kitchen OVER THE COUNTER MEDICATION   Oral   Take 1 tablet by mouth as needed (pain relief/fever reduction.). Over the counter pain reliever/fever reducer.         . meloxicam (MOBIC) 7.5 MG tablet   Oral   Take 2 tablets (15 mg total) by mouth daily.   30 tablet   0    BP 155/92  Pulse 108  Temp(Src) 98.1 F (36.7 C) (Oral)  Resp 16  Ht 5\' 2"  (1.575 m)  SpO2 99%  Physical Exam  Nursing note and vitals reviewed. Constitutional: She is oriented to person, place, and time. She appears well-developed and well-nourished. No distress.  Morbidly obese   HENT:  Head: Normocephalic and atraumatic.  Eyes: Conjunctivae and EOM are normal. No scleral icterus.  Neck: Normal range of motion.  Cardiovascular: Normal rate, regular rhythm and intact distal pulses.   Pulses:      Dorsalis pedis pulses are 2+ on the right side,  and 2+ on the left side.       Posterior tibial pulses are 2+ on the right side, and 2+ on the left side.  Pulmonary/Chest: Effort normal. No respiratory distress.  Musculoskeletal: Normal range of motion.  TTP diffusely throughout the knee without swelling, erythema, crepitus, effusion, or heat to touch. No bony deformities appreciated.  Neurological: She is alert and oriented to person, place, and time.  5/5 strength against resistance with flexion and extension of R knee. DTRs normal and symmetric. No gross sensory deficits appreciated. Patient weight bearing and ambulatory with normal gait.  Skin: Skin is warm and dry. No rash noted. She is not diaphoretic. No erythema. No pallor.  Psychiatric: She has a normal mood and affect. Her behavior is normal.    ED Course  Procedures (including critical care time) COORDINATION OF  CARE:  Nursing notes reviewed. Vital signs reviewed. Initial pt interview and examination performed.   8:27 PM-Discussed work up plan with pt at bedside, which includes left knee x-ray. Pt agrees with plan.  Treatment plan initiated:Medications - No data to display 8:27 PM Nursing Notes Reviewed/ Care Coordinated Applicable Imaging Reviewed and incorporated into ED treatment Discussed results and treatment plan with pt. Pt demonstrates understanding and agrees with plan.  Initial diagnostic testing ordered.    Labs Review Labs Reviewed - No data to display Imaging Review Dg Knee Complete 4 Views Left  12/02/2012   CLINICAL DATA:  Posterior left knee pain. No known injury.  EXAM: LEFT KNEE - COMPLETE 4+ VIEW  COMPARISON:  None.  FINDINGS: There is no evidence of fracture, dislocation, or joint effusion. There is no evidence of arthropathy or other focal bone abnormality. Soft tissues are unremarkable.  IMPRESSION: Normal examination.   Electronically Signed   By: Gordan Payment M.D.   On: 12/02/2012 00:01    EKG Interpretation   None       MDM   1. Knee pain, left    Uncomplicated, atraumatic knee pain. Patient neurovascularly intact and ambulatory in ED today. Normal strength against resistance, sensation, and reflexes on physical exam. She is afebrile without evidence of septic joint. Xray today unremarkable. Patient's knee wrapped in ACE wrap. She is appropriate for d/c with RICE instruction and orthopedic follow up. Referral provided and Mobic prescribed for symptoms. Patient also given resource guide and instructed to f/u with PCP for BP recheck. Return precautions discussed and patient agreeable to plan with no unaddressed concerns.  I personally performed the services described in this documentation, which was scribed in my presence. The recorded information has been reviewed and is accurate.     Antony Madura, PA-C 12/02/12 2031

## 2012-12-01 NOTE — ED Notes (Signed)
Pt c/o L knee pain since last Thursday. Denies injury.  Pt sts the pain starts mid thigh and gets worse and worse towards the knee with a radiating pain in the L foot. Pt denies numbness but c/o tingling in the left leg. A&Ox4. NAD noted.

## 2012-12-02 MED ORDER — MELOXICAM 7.5 MG PO TABS: 15.0000 mg | ORAL_TABLET | Freq: Every day | ORAL | Status: DC

## 2012-12-05 NOTE — ED Provider Notes (Signed)
Medical screening examination/treatment/procedure(s) were performed by non-physician practitioner and as supervising physician I was immediately available for consultation/collaboration.  Bedford Winsor, MD 12/05/12 0327 

## 2012-12-10 ENCOUNTER — Ambulatory Visit: Payer: Self-pay | Attending: Internal Medicine | Admitting: Internal Medicine

## 2012-12-10 ENCOUNTER — Encounter: Payer: Self-pay | Admitting: Internal Medicine

## 2012-12-10 VITALS — BP 144/91 | HR 103 | Temp 98.8°F | Ht 62.0 in | Wt 314.0 lb

## 2012-12-10 DIAGNOSIS — I10 Essential (primary) hypertension: Secondary | ICD-10-CM

## 2012-12-10 LAB — COMPLETE METABOLIC PANEL WITH GFR
ALT: 42 U/L — ABNORMAL HIGH (ref 0–35)
Alkaline Phosphatase: 57 U/L (ref 39–117)
CO2: 29 mEq/L (ref 19–32)
Creat: 0.6 mg/dL (ref 0.50–1.10)
GFR, Est African American: >89 mL/min
GFR, Est Non African American: >89 mL/min
Glucose, Bld: 98 mg/dL (ref 70–99)
Total Bilirubin: 0.2 mg/dL — ABNORMAL LOW (ref 0.3–1.2)

## 2012-12-10 LAB — POCT GLYCOSYLATED HEMOGLOBIN (HGB A1C): Hemoglobin A1C: 6.5

## 2012-12-10 MED ORDER — HYDROCHLOROTHIAZIDE 25 MG PO TABS: 25.0000 mg | ORAL_TABLET | Freq: Every day | ORAL | Status: DC

## 2012-12-10 MED ORDER — CITALOPRAM HYDROBROMIDE 20 MG PO TABS: 20.0000 mg | ORAL_TABLET | Freq: Every day | ORAL | Status: DC

## 2012-12-10 MED ORDER — ENALAPRIL MALEATE 5 MG PO TABS: 5.0000 mg | ORAL_TABLET | Freq: Every day | ORAL | Status: DC

## 2012-12-10 NOTE — Progress Notes (Unsigned)
Patient ID: Krista Williams, female   DOB: 1990/10/04, 22 y.o.   MRN: [id number]   CC:  HPI:  22 year old female who is transgender and lives with another female, presents to the clinic for evaluation of high blood pressure. The patient's blood pressure usually runs in the 140s to 90s she is currently on enalapril for the last 1-1/2 years. She has gained a significant amount of weight but she is unable to quantify She states that she has been evaluated for hirsutism, amenorrhea, morbid obesity in [facility] and was told that she has polycystic ovary disease She has not had a menstrual cycle since 816 She denies using testosterone any other recreational drugs She is a nonsmoker nonalcoholic   No Known Allergies Past Medical History  Diagnosis Date  . Hypertension   . Diabetes mellitus without complication    Current Outpatient Prescriptions on File Prior to Visit  Medication Sig Dispense Refill  . naproxen sodium (ANAPROX) 220 MG tablet Take 220 mg by mouth as needed (pain).      Marland Kitchen OVER THE COUNTER MEDICATION Take 1 tablet by mouth as needed (pain relief/fever reduction.). Over the counter pain reliever/fever reducer.      . meloxicam (MOBIC) 7.5 MG tablet Take 2 tablets (15 mg total) by mouth daily.  30 tablet  0   No current facility-administered medications on file prior to visit.   Family History  Problem Relation Age of Onset  . Hypertension Father   . Diabetes Father   . Cancer Other   . Diabetes Other   . Diabetes Sister   . Diabetes Maternal Grandmother    History   Social History  . Marital Status: Single    Spouse Name: N/A    Number of Children: N/A  . Years of Education: N/A   Occupational History  . Not on file.   Social History Main Topics  . Smoking status: Never Smoker   . Smokeless tobacco: Not on file  . Alcohol Use: No  . Drug Use: Not on file  . Sexual Activity: No   Other Topics Concern  . Not on file   Social History  Narrative  . No narrative on file    Review of Systems  Constitutional: Negative for fever, chills, diaphoresis, activity change, appetite change and fatigue.  HENT: Negative for ear pain, nosebleeds, congestion, facial swelling, rhinorrhea, neck pain, neck stiffness and ear discharge.   Eyes: Negative for pain, discharge, redness, itching and visual disturbance.  Respiratory: Negative for cough, choking, chest tightness, shortness of breath, wheezing and stridor.   Cardiovascular: Negative for chest pain, palpitations and leg swelling.  Gastrointestinal: Negative for abdominal distention.  Genitourinary: Negative for dysuria, urgency, frequency, hematuria, flank pain, decreased urine volume, difficulty urinating and dyspareunia.  Musculoskeletal: Negative for back pain, joint swelling, arthralgias and gait problem.  Neurological: Negative for dizziness, tremors, seizures, syncope, facial asymmetry, speech difficulty, weakness, light-headedness, numbness and headaches.  Hematological: Negative for adenopathy. Does not bruise/bleed easily.  Psychiatric/Behavioral: Negative for hallucinations, behavioral problems, confusion, dysphoric mood, decreased concentration and agitation.    Objective:   Filed Vitals:   12/10/12 1714  BP: 144/91  Pulse: 103  Temp: 98.8 F (37.1 C)    Physical Exam  Constitutional: Appears well-developed and well-nourished. No distress.  HENT: Normocephalic. External right and left ear normal. Oropharynx is clear and moist.  Eyes: Conjunctivae and EOM are normal. PERRLA, no scleral icterus.  Neck: Normal ROM. Neck supple. No JVD. No tracheal deviation.  No thyromegaly.  CVS: RRR, S1/S2 +, no murmurs, no gallops, no carotid bruit.  Pulmonary: Effort and breath sounds normal, no stridor, rhonchi, wheezes, rales.  Abdominal: Soft. BS +,  no distension, tenderness, rebound or guarding.  Musculoskeletal: Normal range of motion. No edema and no tenderness.   Lymphadenopathy: No lymphadenopathy noted, cervical, inguinal. Neuro: Alert. Normal reflexes, muscle tone coordination. No cranial nerve deficit. Skin: Skin is warm and dry. No rash noted. Not diaphoretic. No erythema. No pallor.  Psychiatric: Normal mood and affect. Behavior, judgment, thought content normal.   Lab Results  Component Value Date   WBC 12.1* 11/25/2012   HGB 12.9 11/25/2012   HCT 38.6 11/25/2012   MCV 89.6 11/25/2012   PLT 288 11/25/2012   Lab Results  Component Value Date   CREATININE 0.70 11/25/2012   BUN 12 11/25/2012   NA 136 11/25/2012   K 3.9 11/25/2012   CL 99 11/25/2012   CO2 26 11/25/2012    No results found for this basename: HGBA1C   Lipid Panel  No results found for this basename: chol, trig, hdl, cholhdl, vldl, ldlcalc       Assessment and plan:   There are no active problems to display for this patient.      Hypertension Add Hydrocort either 25 mg to enalapril Blood pressure checks in one month Will draw CMP today to verify renal funct  Amenorrhea/hirsutism We'll check free testosterone and total testosterone, DHEA, Gynecology referral for amenorrhea   Follow up in one month The patient was given clear instructions to go to ER or return to medical center if symptoms don't improve, worsen or new problems develop. The patient verbalized understanding. The patient was told to call to get any lab results if not heard anything in the next week.

## 2012-12-10 NOTE — Progress Notes (Unsigned)
Pt is here to establish care. Pt has suffers from hypertension and is currently on medication that is not working. BP continues to rise and fall. Pt feels strongly about being a diabetic and requests to have an HBA1c performed. Pt is also transgender.

## 2012-12-12 ENCOUNTER — Encounter (HOSPITAL_COMMUNITY): Payer: Self-pay | Admitting: Emergency Medicine

## 2012-12-12 ENCOUNTER — Emergency Department (HOSPITAL_COMMUNITY)
Admission: EM | Admit: 2012-12-12 | Discharge: 2012-12-13 | Disposition: A | Discharge status: Home / Self Care | Payer: Self-pay | Attending: Emergency Medicine | Admitting: Emergency Medicine

## 2012-12-12 DIAGNOSIS — Z79899 Other long term (current) drug therapy: Secondary | ICD-10-CM | POA: Insufficient documentation

## 2012-12-12 DIAGNOSIS — H53149 Visual discomfort, unspecified: Secondary | ICD-10-CM | POA: Insufficient documentation

## 2012-12-12 DIAGNOSIS — R51 Headache: Secondary | ICD-10-CM | POA: Insufficient documentation

## 2012-12-12 DIAGNOSIS — R630 Anorexia: Secondary | ICD-10-CM | POA: Insufficient documentation

## 2012-12-12 DIAGNOSIS — R739 Hyperglycemia, unspecified: Secondary | ICD-10-CM

## 2012-12-12 DIAGNOSIS — R634 Abnormal weight loss: Secondary | ICD-10-CM | POA: Insufficient documentation

## 2012-12-12 DIAGNOSIS — I10 Essential (primary) hypertension: Secondary | ICD-10-CM

## 2012-12-12 DIAGNOSIS — E119 Type 2 diabetes mellitus without complications: Secondary | ICD-10-CM | POA: Insufficient documentation

## 2012-12-12 DIAGNOSIS — G8929 Other chronic pain: Secondary | ICD-10-CM

## 2012-12-12 DIAGNOSIS — M2559 Pain in other specified joint: Secondary | ICD-10-CM | POA: Insufficient documentation

## 2012-12-12 DIAGNOSIS — L680 Hirsutism: Secondary | ICD-10-CM | POA: Insufficient documentation

## 2012-12-12 LAB — DHEA-SULFATE: DHEA-SO4: 149 ug/dL (ref 35–430)

## 2012-12-12 NOTE — ED Notes (Signed)
Patient is alert and oriented x3. She is complaining of generalized body aches that she states she has been dealing with for 3 weeks. He has not been eating well over the last few weeks due to not feeling well and lost 10 lbs

## 2012-12-12 NOTE — ED Notes (Signed)
Pt says she came to the ER for high blood pressure, high blood sugar, and headache that has been going on for a few weeks. Pt has a hx of diabetes and HTN.

## 2012-12-13 LAB — GLUCOSE, CAPILLARY: Glucose-Capillary: 219 mg/dL — ABNORMAL HIGH (ref 70–99)

## 2012-12-13 MED ORDER — METFORMIN HCL 500 MG PO TABS: 500.0000 mg | ORAL_TABLET | Freq: Two times a day (BID) | ORAL | Status: DC

## 2012-12-13 MED ORDER — BUTALBITAL-APAP-CAFFEINE 50-325-40 MG PO TABS
1.0000 | ORAL_TABLET | Freq: Four times a day (QID) | ORAL | Status: DC | PRN
Start: 2012-12-13 — End: 2012-12-13
  Administered 2012-12-13: 1 via ORAL
  Filled 2012-12-13: qty 1

## 2012-12-13 MED ORDER — FLUTICASONE PROPIONATE 50 MCG/ACT NA SUSP: 2.0000 | Freq: Every day | NASAL | Status: DC

## 2012-12-13 MED ORDER — BUTALBITAL-APAP-CAFFEINE 50-325-40 MG PO TABS: 1.0000 | ORAL_TABLET | Freq: Four times a day (QID) | ORAL | Status: DC | PRN

## 2012-12-13 MED ORDER — ENALAPRIL MALEATE 5 MG PO TABS: 10.0000 mg | ORAL_TABLET | Freq: Every day | ORAL | Status: DC

## 2012-12-13 MED ORDER — FLUTICASONE PROPIONATE 50 MCG/ACT NA SUSP
2.0000 | Freq: Every day | NASAL | Status: DC
  Administered 2012-12-13: 2 via NASAL
  Filled 2012-12-13: qty 16

## 2012-12-13 NOTE — ED Provider Notes (Signed)
CSN: [id number]     Arrival date & time 12/12/12  2326 History   First MD Initiated Contact with Patient 12/12/12 2345     Chief Complaint  Patient presents with  . Generalized Body Aches   (Consider location/radiation/quality/duration/timing/severity/associated sxs/prior Treatment) HPI 22 yo female presents to the ER from home with multiple complaints.  Pt is in process of female to female gender change.  He reports 2-3 weeks of constant headaches, elevated bps, elevated blood sugars, body aches, decreased appetite.  Pt seen in the ED twice for similar sxs in the past month.  CT scan of head negative.  Pt seen three days ago by pcm who was concerned that pt had high testosterone, added on hctz for bp control.  Pt has been referred to gyn for elevated testosterone.  He reports he was more concerned about bp, but this seemed to be burshed off due to elevated testosterone.  HA are all day every day and vary between 8-10 in intensity.  HA are global.  Some mild photo/phono phobia.  He reports 10 lb weight loss in 2 days, but reports was weighed on two different scales.  BPs at home are ranging from 140s-170s systolic.  Pt not currently on tx for DM, last took meds about 3 years ago.  BS at home reported to be 101 tonight, which concerned him.  Past Medical History  Diagnosis Date  . Hypertension   . Diabetes mellitus without complication    History reviewed. No pertinent past surgical history. Family History  Problem Relation Age of Onset  . Hypertension Father   . Diabetes Father   . Cancer Other   . Diabetes Other   . Diabetes Sister   . Diabetes Maternal Grandmother    History  Substance Use Topics  . Smoking status: Never Smoker   . Smokeless tobacco: Not on file  . Alcohol Use: No   OB History   Grav Para Term Preterm Abortions TAB SAB Ect Mult Living                 Review of Systems  Constitutional: Positive for activity change, appetite change, fatigue and unexpected weight  change.  Eyes: Negative.   Respiratory: Negative.   Cardiovascular: Negative.   Endocrine: Negative.   Genitourinary: Negative.   Musculoskeletal: Positive for arthralgias and myalgias.  Neurological: Positive for dizziness, weakness, light-headedness and headaches.  Hematological: Negative.   Psychiatric/Behavioral: Negative.     Allergies  Review of patient's allergies indicates no known allergies.  Home Medications   Current Outpatient Rx  Name  Route  Sig  Dispense  Refill  . acetaminophen (TYLENOL) 500 MG tablet   Oral   Take 500 mg by mouth every 6 (six) hours as needed for moderate pain.         . citalopram (CELEXA) 20 MG tablet   Oral   Take 1 tablet (20 mg total) by mouth daily.   90 tablet   2   . hydrochlorothiazide (HYDRODIURIL) 25 MG tablet   Oral   Take 1 tablet (25 mg total) by mouth daily.   90 tablet   3   . naproxen sodium (ANAPROX) 220 MG tablet   Oral   Take 220 mg by mouth as needed (pain).         . butalbital-acetaminophen-caffeine (FIORICET, ESGIC) 50-325-40 MG per tablet   Oral   Take 1 tablet by mouth every 6 (six) hours as needed for headache.   20 tablet  0   . enalapril (VASOTEC) 5 MG tablet   Oral   Take 2 tablets (10 mg total) by mouth daily.   30 tablet   0   . fluticasone (FLONASE) 50 MCG/ACT nasal spray   Each Nare   Place 2 sprays into both nostrils daily.   16 g   2   . metFORMIN (GLUCOPHAGE) 500 MG tablet   Oral   Take 1 tablet (500 mg total) by mouth 2 (two) times daily with a meal.   30 tablet   0    BP 144/84  Pulse 114  Temp(Src) 98.2 F (36.8 C) (Oral)  Resp 18  SpO2 96% Physical Exam  Nursing note and vitals reviewed. Constitutional: She is oriented to person, place, and time. She appears well-developed and well-nourished. No distress.  Morbidly obese hirsute NAD   HENT:  Head: Normocephalic and atraumatic.  Left Ear: External ear normal.  Nose: Nose normal.  Mouth/Throat: Oropharynx is  clear and moist.  Fluid noted behind right ear  Eyes: Conjunctivae and EOM are normal. Pupils are equal, round, and reactive to light.  Neck: Normal range of motion. Neck supple. No JVD present. No tracheal deviation present. No thyromegaly present.  Cardiovascular: Normal rate, regular rhythm, normal heart sounds and intact distal pulses.  Exam reveals no gallop and no friction rub.   No murmur heard. Pulmonary/Chest: Effort normal and breath sounds normal. No stridor. No respiratory distress. She has no wheezes. She has no rales. She exhibits no tenderness.  Abdominal: Soft. Bowel sounds are normal. She exhibits no distension and no mass. There is no tenderness. There is no rebound and no guarding.  Musculoskeletal: Normal range of motion. She exhibits no edema and no tenderness.  Lymphadenopathy:    She has no cervical adenopathy.  Neurological: She is alert and oriented to person, place, and time. She has normal reflexes. No cranial nerve deficit. She exhibits normal muscle tone. Coordination normal.  Skin: Skin is warm and dry. No rash noted. No erythema. No pallor.  Psychiatric: She has a normal mood and affect. Her behavior is normal. Judgment and thought content normal.    ED Course  Procedures (including critical care time) Labs Review Labs Reviewed  GLUCOSE, CAPILLARY - Abnormal; Notable for the following:    Glucose-Capillary 219 (*)    All other components within normal limits   Imaging Review No results found.  EKG Interpretation   None       MDM   1. Chronic headache   2. Hypertension   3. Hyperglycemia    22 yo female with multiple complaints.  Exam here normal aside from small effusion behind right tm, recent CT of head with right mastoid effusion.  No pain on palpation of mastoid or ear.  Will start on flonase.  Will increase enalapril and start metformin.  Pt encouraged to f/u with wellness clinic for recheck on bp and cbg.  Referred to neurology for chronic  headaches.    Olivia Mackie, MD 12/13/12 [phone]

## 2012-12-24 ENCOUNTER — Telehealth: Payer: Self-pay | Admitting: Internal Medicine

## 2012-12-24 NOTE — Telephone Encounter (Signed)
Pt called regarding her lab results, please contact pt °

## 2012-12-29 ENCOUNTER — Telehealth: Payer: Self-pay | Admitting: Emergency Medicine

## 2012-12-29 ENCOUNTER — Telehealth: Payer: Self-pay | Admitting: Internal Medicine

## 2012-12-29 MED ORDER — METFORMIN HCL 500 MG PO TABS: 500.0000 mg | ORAL_TABLET | Freq: Two times a day (BID) | ORAL | Status: DC

## 2012-12-29 NOTE — Telephone Encounter (Signed)
Pt called regarding a refill of her medication Metformin, Please contact pt

## 2012-12-29 NOTE — Telephone Encounter (Signed)
Left voicemail- Metformin refilled and e-scribed to [facility] pharmacy.

## 2013-01-12 ENCOUNTER — Ambulatory Visit: Payer: Self-pay | Admitting: Internal Medicine

## 2013-03-04 ENCOUNTER — Encounter: Payer: Self-pay | Admitting: Obstetrics & Gynecology

## 2013-10-06 ENCOUNTER — Emergency Department (HOSPITAL_COMMUNITY)
Admission: EM | Admit: 2013-10-06 | Discharge: 2013-10-07 | Disposition: A | Discharge status: Home / Self Care | Payer: Self-pay | Attending: Emergency Medicine | Admitting: Emergency Medicine

## 2013-10-06 DIAGNOSIS — I10 Essential (primary) hypertension: Secondary | ICD-10-CM | POA: Insufficient documentation

## 2013-10-06 DIAGNOSIS — Z79899 Other long term (current) drug therapy: Secondary | ICD-10-CM | POA: Insufficient documentation

## 2013-10-06 DIAGNOSIS — R51 Headache: Secondary | ICD-10-CM | POA: Insufficient documentation

## 2013-10-06 DIAGNOSIS — E119 Type 2 diabetes mellitus without complications: Secondary | ICD-10-CM | POA: Insufficient documentation

## 2013-10-06 DIAGNOSIS — R519 Headache, unspecified: Secondary | ICD-10-CM

## 2013-10-06 HISTORY — DX: Pure hypercholesterolemia, unspecified: E78.00

## 2013-10-07 ENCOUNTER — Encounter (HOSPITAL_COMMUNITY): Payer: Self-pay | Admitting: Emergency Medicine

## 2013-10-07 MED ORDER — METOCLOPRAMIDE HCL 5 MG/ML IJ SOLN
10.0000 mg | INTRAMUSCULAR | Status: AC
  Administered 2013-10-07: 10 mg via INTRAVENOUS
  Filled 2013-10-07: qty 2

## 2013-10-07 MED ORDER — KETOROLAC TROMETHAMINE 30 MG/ML IJ SOLN
30.0000 mg | Freq: Once | INTRAMUSCULAR | Status: AC
  Administered 2013-10-07: 30 mg via INTRAVENOUS
  Filled 2013-10-07: qty 1

## 2013-10-07 MED ORDER — SODIUM CHLORIDE 0.9 % IV BOLUS (SEPSIS)
1000.0000 mL | Freq: Once | INTRAVENOUS | Status: AC
  Administered 2013-10-07: 1000 mL via INTRAVENOUS

## 2013-10-07 NOTE — Discharge Instructions (Signed)
Recommend you followup with a neurologist for further evaluation of your frequent headaches. Followup with either [facility] or [facility]. Recommend you continue taking your propranolol daily. Take naproxen 500 mg twice a day for symptom management as needed. Recommend that you rest in a quiet dark room for at least 8 hours today. Return to the emergency department as needed if symptoms worsen.  Headaches, Frequently Asked Questions MIGRAINE HEADACHES Q: What is migraine? What causes it? How can I treat it? A: Generally, migraine headaches begin as a dull ache. Then they develop into a constant, throbbing, and pulsating pain. You may experience pain at the temples. You may experience pain at the front or back of one or both sides of the head. The pain is usually accompanied by a combination of:  Nausea.  Vomiting.  Sensitivity to light and noise. Some people (about 15%) experience an aura (see below) before an attack. The cause of migraine is believed to be chemical reactions in the brain. Treatment for migraine may include over-the-counter or prescription medications. It may also include self-help techniques. These include relaxation training and biofeedback.  Q: What is an aura? A: About 15% of people with migraine get an "aura". This is a sign of neurological symptoms that occur before a migraine headache. You may see wavy or jagged lines, dots, or flashing lights. You might experience tunnel vision or blind spots in one or both eyes. The aura can include visual or auditory hallucinations (something imagined). It may include disruptions in smell (such as strange odors), taste or touch. Other symptoms include:  Numbness.  A "pins and needles" sensation.  Difficulty in recalling or speaking the correct word. These neurological events may last as long as 60 minutes. These symptoms will fade as the headache begins. Q: What is a trigger? A: Certain physical or environmental  factors can lead to or "trigger" a migraine. These include:  Foods.  Hormonal changes.  Weather.  Stress. It is important to remember that triggers are different for everyone. To help prevent migraine attacks, you need to figure out which triggers affect you. Keep a headache diary. This is a good way to track triggers. The diary will help you talk to your healthcare professional about your condition. Q: Does weather affect migraines? A: Bright sunshine, hot, humid conditions, and drastic changes in barometric pressure may lead to, or "trigger," a migraine attack in some people. But studies have shown that weather does not act as a trigger for everyone with migraines. Q: What is the link between migraine and hormones? A: Hormones start and regulate many of your body's functions. Hormones keep your body in balance within a constantly changing environment. The levels of hormones in your body are unbalanced at times. Examples are during menstruation, pregnancy, or menopause. That can lead to a migraine attack. In fact, about three quarters of all women with migraine report that their attacks are related to the menstrual cycle.  Q: Is there an increased risk of stroke for migraine sufferers? A: The likelihood of a migraine attack causing a stroke is very remote. That is not to say that migraine sufferers cannot have a stroke associated with their migraines. In persons under age 66, the most common associated factor for stroke is migraine headache. But over the course of a person's normal life span, the occurrence of migraine headache may actually be associated with a reduced risk of dying from cerebrovascular disease due to stroke.  Q: What are acute medications for migraine?  A: Acute medications are used to treat the pain of the headache after it has started. Examples over-the-counter medications, NSAIDs, ergots, and triptans.  Q: What are the triptans? A: Triptans are the newest class of abortive  medications. They are specifically targeted to treat migraine. Triptans are vasoconstrictors. They moderate some chemical reactions in the brain. The triptans work on receptors in your brain. Triptans help to restore the balance of a neurotransmitter called serotonin. Fluctuations in levels of serotonin are thought to be a main cause of migraine.  Q: Are over-the-counter medications for migraine effective? A: Over-the-counter, or "OTC," medications may be effective in relieving mild to moderate pain and associated symptoms of migraine. But you should see your caregiver before beginning any treatment regimen for migraine.  Q: What are preventive medications for migraine? A: Preventive medications for migraine are sometimes referred to as "prophylactic" treatments. They are used to reduce the frequency, severity, and length of migraine attacks. Examples of preventive medications include antiepileptic medications, antidepressants, beta-blockers, calcium channel blockers, and NSAIDs (nonsteroidal anti-inflammatory drugs). Q: Why are anticonvulsants used to treat migraine? A: During the past few years, there has been an increased interest in antiepileptic drugs for the prevention of migraine. They are sometimes referred to as "anticonvulsants". Both epilepsy and migraine may be caused by similar reactions in the brain.  Q: Why are antidepressants used to treat migraine? A: Antidepressants are typically used to treat people with depression. They may reduce migraine frequency by regulating chemical levels, such as serotonin, in the brain.  Q: What alternative therapies are used to treat migraine? A: The term "alternative therapies" is often used to describe treatments considered outside the scope of conventional Western medicine. Examples of alternative therapy include acupuncture, acupressure, and yoga. Another common alternative treatment is herbal therapy. Some herbs are believed to relieve headache pain.  Always discuss alternative therapies with your caregiver before proceeding. Some herbal products contain arsenic and other toxins. TENSION HEADACHES Q: What is a tension-type headache? What causes it? How can I treat it? A: Tension-type headaches occur randomly. They are often the result of temporary stress, anxiety, fatigue, or anger. Symptoms include soreness in your temples, a tightening band-like sensation around your head (a "vice-like" ache). Symptoms can also include a pulling feeling, pressure sensations, and contracting head and neck muscles. The headache begins in your forehead, temples, or the back of your head and neck. Treatment for tension-type headache may include over-the-counter or prescription medications. Treatment may also include self-help techniques such as relaxation training and biofeedback. CLUSTER HEADACHES Q: What is a cluster headache? What causes it? How can I treat it? A: Cluster headache gets its name because the attacks come in groups. The pain arrives with little, if any, warning. It is usually on one side of the head. A tearing or bloodshot eye and a runny nose on the same side of the headache may also accompany the pain. Cluster headaches are believed to be caused by chemical reactions in the brain. They have been described as the most severe and intense of any headache type. Treatment for cluster headache includes prescription medication and oxygen. SINUS HEADACHES Q: What is a sinus headache? What causes it? How can I treat it? A: When a cavity in the bones of the face and skull (a sinus) becomes inflamed, the inflammation will cause localized pain. This condition is usually the result of an allergic reaction, a tumor, or an infection. If your headache is caused by a sinus blockage, such as  an infection, you will probably have a fever. An x-ray will confirm a sinus blockage. Your caregiver's treatment might include antibiotics for the infection, as well as antihistamines  or decongestants.  REBOUND HEADACHES Q: What is a rebound headache? What causes it? How can I treat it? A: A pattern of taking acute headache medications too often can lead to a condition known as "rebound headache." A pattern of taking too much headache medication includes taking it more than 2 days per week or in excessive amounts. That means more than the label or a caregiver advises. With rebound headaches, your medications not only stop relieving pain, they actually begin to cause headaches. Doctors treat rebound headache by tapering the medication that is being overused. Sometimes your caregiver will gradually substitute a different type of treatment or medication. Stopping may be a challenge. Regularly overusing a medication increases the potential for serious side effects. Consult a caregiver if you regularly use headache medications more than 2 days per week or more than the label advises. ADDITIONAL QUESTIONS AND ANSWERS Q: What is biofeedback? A: Biofeedback is a self-help treatment. Biofeedback uses special equipment to monitor your body's involuntary physical responses. Biofeedback monitors:  Breathing.  Pulse.  Heart rate.  Temperature.  Muscle tension.  Brain activity. Biofeedback helps you refine and perfect your relaxation exercises. You learn to control the physical responses that are related to stress. Once the technique has been mastered, you do not need the equipment any more. Q: Are headaches hereditary? A: Four out of five (80%) of people that suffer report a family history of migraine. Scientists are not sure if this is genetic or a family predisposition. Despite the uncertainty, a child has a 50% chance of having migraine if one parent suffers. The child has a 75% chance if both parents suffer.  Q: Can children get headaches? A: By the time they reach high school, most young people have experienced some type of headache. Many safe and effective approaches or  medications can prevent a headache from occurring or stop it after it has begun.  Q: What type of doctor should I see to diagnose and treat my headache? A: Start with your primary caregiver. Discuss his or her experience and approach to headaches. Discuss methods of classification, diagnosis, and treatment. Your caregiver may decide to recommend you to a headache specialist, depending upon your symptoms or other physical conditions. Having diabetes, allergies, etc., may require a more comprehensive and inclusive approach to your headache. The National Headache Foundation will provide, upon request, a list of [facility] physician members in your state. Document Released: 03/31/2003 Document Revised: 04/02/2011 Document Reviewed: 09/08/2007 [facility] Patient Information 2015 [place], [place]. This information is not intended to replace advice given to you by your health care provider. Make sure you discuss any questions you have with your health care provider.

## 2013-10-07 NOTE — ED Provider Notes (Signed)
Medical screening examination/treatment/procedure(s) were performed by non-physician practitioner and as supervising physician I was immediately available for consultation/collaboration.   EKG Interpretation None        Wandra Arthurs, MD 10/07/13 [phone]

## 2013-10-07 NOTE — ED Notes (Signed)
Pt states she has had a headache all day with dizziness. States no hx of migraines but she gets headaches often. Neuro intact. Alert and oriented.

## 2013-10-07 NOTE — ED Provider Notes (Signed)
CSN: [id number]     Arrival date & time 10/06/13  2313 History   First MD Initiated Contact with Patient 10/07/13 [phone]     Chief Complaint  Patient presents with  . Headache    (Consider location/radiation/quality/duration/timing/severity/associated sxs/prior Treatment) HPI Comments: Patient is a 23 year old female undergoing transitions and female gender who presents to the emergency department for headache. Patient states that headache has been constant x2 weeks and is generalized. Pain is waxing and waning in severity and has not been improved with naproxen, propranolol, ibuprofen, or Excedrin. Symptoms associated with intermittent lightheadedness. Patient states that she has had similar headaches in the past. Per chart review, patient has documented visit for similar headache in October 2014. Patient denies associated fever, syncope, neck stiffness, facial drooping, nausea, vomiting, vision changes or vision loss, tinnitus or hearing loss, head injury or trauma, extremity numbness/paresthesias, or extremity weakness. Patient denies following up with a neurologist; symptoms currently followed by her PCP.  Patient is a 23 y.o. female presenting with headaches. The history is provided by the patient. No language interpreter was used.  Headache   Past Medical History  Diagnosis Date  . Hypertension   . Diabetes mellitus without complication   . High cholesterol    No past surgical history on file. Family History  Problem Relation Age of Onset  . Hypertension Father   . Diabetes Father   . Cancer Other   . Diabetes Other   . Diabetes Sister   . Diabetes Maternal Grandmother    History  Substance Use Topics  . Smoking status: Never Smoker   . Smokeless tobacco: Not on file  . Alcohol Use: No   OB History   Grav Para Term Preterm Abortions TAB SAB Ect Mult Living                  Review of Systems  Neurological: Positive for light-headedness and headaches.  All other systems  reviewed and are negative.   Allergies  Review of patient's allergies indicates no known allergies.  Home Medications   Prior to Admission medications   Medication Sig Start Date End Date Taking? Authorizing Provider  acetaminophen (TYLENOL) 500 MG tablet Take 500 mg by mouth every 6 (six) hours as needed for moderate pain.   Yes Historical Provider, MD  ARIPiprazole (ABILIFY) 10 MG tablet Take 20 mg by mouth at bedtime.   Yes Historical Provider, MD  enalapril (VASOTEC) 5 MG tablet Take 2 tablets (10 mg total) by mouth daily. 12/13/12  Yes Kalman Drape, MD  folic acid (FOLVITE) 951 MCG tablet Take 400 mcg by mouth daily.   Yes Historical Provider, MD  glimepiride (AMARYL) 1 MG tablet Take 1 mg by mouth daily with breakfast.   Yes Historical Provider, MD  hydrochlorothiazide (HYDRODIURIL) 25 MG tablet Take 1 tablet (25 mg total) by mouth daily. 12/10/12  Yes Reyne Dumas, MD  omega-3 acid ethyl esters (LOVAZA) 1 G capsule Take 1 g by mouth daily.   Yes Historical Provider, MD  ondansetron (ZOFRAN-ODT) 4 MG disintegrating tablet Take 4 mg by mouth every 8 (eight) hours as needed for nausea or vomiting.   Yes Historical Provider, MD  propranolol (INDERAL) 20 MG tablet Take 20 mg by mouth 2 (two) times daily.   Yes Historical Provider, MD   BP 124/82  Pulse 103  Temp(Src) 98.4 F (36.9 C) (Oral)  Resp 16  SpO2 97%  Physical Exam  Nursing note and vitals reviewed. Constitutional: She is oriented  to person, place, and time. She appears well-developed and well-nourished. No distress.  Nontoxic/nonseptic appearing  HENT:  Head: Normocephalic and atraumatic.  Mouth/Throat: Oropharynx is clear and moist. No oropharyngeal exudate.  Oropharynx clear. Symmetric rise of the uvula with phonation.  Eyes: Conjunctivae and EOM are normal. Pupils are equal, round, and reactive to light. No scleral icterus.  Pupils equal round and reactive to direct and consensual light. EOMs normal without  nystagmus.  Neck: Normal range of motion.  No nuchal rigidity or meningismus  Cardiovascular: Normal rate, regular rhythm and intact distal pulses.   Pulmonary/Chest: Effort normal. No respiratory distress. She has no wheezes.  Chest expansion symmetric  Musculoskeletal: Normal range of motion.  Neurological: She is alert and oriented to person, place, and time. She displays normal reflexes. No cranial nerve deficit. She exhibits normal muscle tone. Coordination normal.  GCS 15. Speech is goal oriented. No cranial nerve deficits appreciated; symmetric eyebrow raise, no facial drooping, tongue midline. Patient has equal grip strength bilaterally and 5/5 strength against resistance in all major muscle groups bilaterally. Patient moves extremities without ataxia. Finger to nose intact. No gross sensory deficits appreciated. DTRs normal and symmetric.  Skin: Skin is warm and dry. No rash noted. She is not diaphoretic. No erythema. No pallor.  Psychiatric: She has a normal mood and affect. Her behavior is normal.    ED Course  Procedures (including critical care time) Labs Review Labs Reviewed - No data to display  Imaging Review No results found.   EKG Interpretation None      MDM   Final diagnoses:  Acute nonintractable headache, unspecified headache type    23 year old female presents to the emergency department for a headache x2 weeks. Symptoms associated with lightheadedness. Patient today as well and nontoxic appearing, hemodynamically stable, and afebrile. Neurologic exam today is nonfocal. No nuchal rigidity or meningismus to suggest meningitis. Prior records are reviewed. Patient has a history of similar headache in November 2014, also lasting approximately 2 weeks and associated with lightheadedness. Patient does endorse having similar symptoms in the past as well. Given history of similar symptoms and reassuring neurologic exam, do not believe further workup with imaging is  indicated. Prior CT imaging reviewed which were negative for acute intracranial process.  Patient treated in ED with Toradol and Reglan as well as IV fluids. Patient endorses improvement in her headache with this regimen. I have offered additional treatment for pain control in the emergency department; however, patient states that she feels comfortable managing her symptoms further at home. Patient stable and appropriate for discharge with instruction to follow up with neurology as an outpatient given her more frequent headaches. Referral provided as well as return precautions. Patient agreeable to plan with no unaddressed concerns.   Antonietta Breach, PA-C 10/07/13 [phone]

## 2015-10-12 ENCOUNTER — Emergency Department (HOSPITAL_COMMUNITY)
Admission: EM | Admit: 2015-10-12 | Discharge: 2015-10-13 | Disposition: A | Discharge status: Home / Self Care | Payer: Self-pay | Attending: Dermatology | Admitting: Dermatology

## 2015-10-12 ENCOUNTER — Emergency Department (HOSPITAL_COMMUNITY): Payer: Self-pay

## 2015-10-12 DIAGNOSIS — E119 Type 2 diabetes mellitus without complications: Secondary | ICD-10-CM | POA: Insufficient documentation

## 2015-10-12 DIAGNOSIS — Y929 Unspecified place or not applicable: Secondary | ICD-10-CM | POA: Insufficient documentation

## 2015-10-12 DIAGNOSIS — Z79899 Other long term (current) drug therapy: Secondary | ICD-10-CM | POA: Insufficient documentation

## 2015-10-12 DIAGNOSIS — Y999 Unspecified external cause status: Secondary | ICD-10-CM | POA: Insufficient documentation

## 2015-10-12 DIAGNOSIS — Z5321 Procedure and treatment not carried out due to patient leaving prior to being seen by health care provider: Secondary | ICD-10-CM | POA: Insufficient documentation

## 2015-10-12 DIAGNOSIS — W541XXA Struck by dog, initial encounter: Secondary | ICD-10-CM | POA: Insufficient documentation

## 2015-10-12 DIAGNOSIS — I10 Essential (primary) hypertension: Secondary | ICD-10-CM | POA: Insufficient documentation

## 2015-10-12 DIAGNOSIS — S4992XA Unspecified injury of left shoulder and upper arm, initial encounter: Secondary | ICD-10-CM | POA: Insufficient documentation

## 2015-10-12 DIAGNOSIS — Z7984 Long term (current) use of oral hypoglycemic drugs: Secondary | ICD-10-CM | POA: Insufficient documentation

## 2015-10-12 DIAGNOSIS — Y9301 Activity, walking, marching and hiking: Secondary | ICD-10-CM | POA: Insufficient documentation

## 2015-10-12 NOTE — ED Triage Notes (Addendum)
Pt states that two days ago he was walking a dog that pulled away and pulled on his L shoulder. Shoulder and arm have continued to hurt since that time. Alert and oriented. Pt prefers to be referred to as a female.

## 2015-10-12 NOTE — ED Notes (Signed)
Pt gave registration her stickers and said she was leaving

## 2015-10-22 ENCOUNTER — Encounter (HOSPITAL_COMMUNITY): Payer: Self-pay | Admitting: *Deleted

## 2015-10-22 ENCOUNTER — Emergency Department (HOSPITAL_COMMUNITY)
Admission: EM | Admit: 2015-10-22 | Discharge: 2015-10-23 | Disposition: A | Discharge status: Home / Self Care | Payer: Self-pay | Attending: Emergency Medicine | Admitting: Emergency Medicine

## 2015-10-22 ENCOUNTER — Emergency Department (HOSPITAL_COMMUNITY): Payer: Self-pay

## 2015-10-22 DIAGNOSIS — I10 Essential (primary) hypertension: Secondary | ICD-10-CM | POA: Insufficient documentation

## 2015-10-22 DIAGNOSIS — R1030 Lower abdominal pain, unspecified: Secondary | ICD-10-CM | POA: Insufficient documentation

## 2015-10-22 DIAGNOSIS — Z79899 Other long term (current) drug therapy: Secondary | ICD-10-CM | POA: Insufficient documentation

## 2015-10-22 DIAGNOSIS — R51 Headache: Secondary | ICD-10-CM | POA: Insufficient documentation

## 2015-10-22 DIAGNOSIS — Z794 Long term (current) use of insulin: Secondary | ICD-10-CM | POA: Insufficient documentation

## 2015-10-22 DIAGNOSIS — E119 Type 2 diabetes mellitus without complications: Secondary | ICD-10-CM | POA: Insufficient documentation

## 2015-10-22 DIAGNOSIS — R102 Pelvic and perineal pain: Secondary | ICD-10-CM

## 2015-10-22 DIAGNOSIS — R112 Nausea with vomiting, unspecified: Secondary | ICD-10-CM | POA: Insufficient documentation

## 2015-10-22 DIAGNOSIS — B9689 Other specified bacterial agents as the cause of diseases classified elsewhere: Secondary | ICD-10-CM

## 2015-10-22 DIAGNOSIS — R519 Headache, unspecified: Secondary | ICD-10-CM

## 2015-10-22 DIAGNOSIS — N938 Other specified abnormal uterine and vaginal bleeding: Secondary | ICD-10-CM | POA: Insufficient documentation

## 2015-10-22 DIAGNOSIS — R197 Diarrhea, unspecified: Secondary | ICD-10-CM | POA: Insufficient documentation

## 2015-10-22 DIAGNOSIS — N760 Acute vaginitis: Secondary | ICD-10-CM | POA: Insufficient documentation

## 2015-10-22 LAB — URINALYSIS, ROUTINE W REFLEX MICROSCOPIC
Bilirubin Urine: NEGATIVE
Ketones, ur: NEGATIVE mg/dL
LEUKOCYTES UA: NEGATIVE
Nitrite: NEGATIVE
PROTEIN: NEGATIVE mg/dL
SPECIFIC GRAVITY, URINE: 1.029 (ref 1.005–1.030)
pH: 6 (ref 5.0–8.0)

## 2015-10-22 LAB — COMPREHENSIVE METABOLIC PANEL
ALT: 41 U/L (ref 14–54)
ANION GAP: 10 (ref 5–15)
AST: 26 U/L (ref 15–41)
Albumin: 4.1 g/dL (ref 3.5–5.0)
Alkaline Phosphatase: 62 U/L (ref 38–126)
BILIRUBIN TOTAL: 0.3 mg/dL (ref 0.3–1.2)
BUN: 11 mg/dL (ref 6–20)
CHLORIDE: 95 mmol/L — AB (ref 101–111)
CO2: 29 mmol/L (ref 22–32)
Calcium: 9.8 mg/dL (ref 8.9–10.3)
Creatinine, Ser: 0.61 mg/dL (ref 0.44–1.00)
Glucose, Bld: 397 mg/dL — ABNORMAL HIGH (ref 65–99)
POTASSIUM: 3.1 mmol/L — AB (ref 3.5–5.1)
Sodium: 134 mmol/L — ABNORMAL LOW (ref 135–145)
TOTAL PROTEIN: 7.7 g/dL (ref 6.5–8.1)

## 2015-10-22 LAB — CBC WITH DIFFERENTIAL/PLATELET
BASOS ABS: 0 10*3/uL (ref 0.0–0.1)
Basophils Relative: 0 %
EOS PCT: 2 %
Eosinophils Absolute: 0.2 10*3/uL (ref 0.0–0.7)
HEMATOCRIT: 42.8 % (ref 36.0–46.0)
HEMOGLOBIN: 15.2 g/dL — AB (ref 12.0–15.0)
LYMPHS ABS: 2.5 10*3/uL (ref 0.7–4.0)
LYMPHS PCT: 19 %
MCH: 31.4 pg (ref 26.0–34.0)
MCHC: 35.5 g/dL (ref 30.0–36.0)
MCV: 88.4 fL (ref 78.0–100.0)
Monocytes Absolute: 0.6 10*3/uL (ref 0.1–1.0)
Monocytes Relative: 4 %
NEUTROS ABS: 9.9 10*3/uL — AB (ref 1.7–7.7)
NEUTROS PCT: 75 %
PLATELETS: 286 10*3/uL (ref 150–400)
RBC: 4.84 MIL/uL (ref 3.87–5.11)
RDW: 12.4 % (ref 11.5–15.5)
WBC: 13.2 10*3/uL — AB (ref 4.0–10.5)

## 2015-10-22 LAB — URINE MICROSCOPIC-ADD ON

## 2015-10-22 LAB — WET PREP, GENITAL
SPERM: NONE SEEN
Trich, Wet Prep: NONE SEEN
Yeast Wet Prep HPF POC: NONE SEEN

## 2015-10-22 LAB — CBG MONITORING, ED: GLUCOSE-CAPILLARY: 371 mg/dL — AB (ref 65–99)

## 2015-10-22 MED ORDER — SODIUM CHLORIDE 0.9 % IV BOLUS (SEPSIS)
1000.0000 mL | Freq: Once | INTRAVENOUS | Status: AC
  Administered 2015-10-22: 1000 mL via INTRAVENOUS

## 2015-10-22 MED ORDER — ONDANSETRON HCL 4 MG/2ML IJ SOLN
4.0000 mg | Freq: Once | INTRAMUSCULAR | Status: AC
  Administered 2015-10-22: 4 mg via INTRAVENOUS
  Filled 2015-10-22: qty 2

## 2015-10-22 NOTE — ED Provider Notes (Signed)
[facility] DEPT Provider Note   CSN: [id number] Arrival date & time: 10/22/15  2114     History   Chief Complaint Chief Complaint  Patient presents with  . Headache  . Near Syncope    HPI Krista Williams is a 25 y.o. female.  HPI   Patient is a 25 year old female with preferred gender identity as female with a history of migraines, HTN, DM2, PCOS who presents the emergency department with 1 week of uncontrolled headaches, 3 days of nausea, and one episode of vomiting yesterday. Associated chills. Patient has taken ibuprofen without relief. He states he gets headaches when he has fluid behind his ears. She denies ear pain. Patient also endorses 3-4 weeks of vaginal bleeding, suprapubic cramping, and abdominal bloating. Patient states he has not had a period in 10 years. Patient is not currently taking any hormones such testosterone. Patient states tonight he felt the sensation that he was going to pass out but didn't. Patient denies hematemesis, diarrhea, blood in the stool, fever, visual changes, numbness, weakness, chest pain, shortness of breath.  Past Medical History:  Diagnosis Date  . Diabetes mellitus without complication ([place])   . High cholesterol   . Hypertension     There are no active problems to display for this patient.   History reviewed. No pertinent surgical history.  OB History    No data available       Home Medications    Prior to Admission medications   Medication Sig Start Date End Date Taking? Authorizing Provider  ALPRAZolam Duanne Moron) 0.5 MG tablet Take 0.5 mg by mouth 3 (three) times daily as needed for anxiety.   Yes Historical Provider, MD  citalopram (CELEXA) 40 MG tablet Take 40 mg by mouth daily.   Yes Historical Provider, MD  hydrochlorothiazide (HYDRODIURIL) 25 MG tablet Take 1 tablet (25 mg total) by mouth daily. 12/10/12  Yes Reyne Dumas, MD  ibuprofen (ADVIL,MOTRIN) 200 MG tablet Take 1,600 mg by mouth every 8 (eight) hours as needed  for moderate pain.   Yes Historical Provider, MD  insulin glargine (LANTUS) 100 UNIT/ML injection Inject 30 Units into the skin 2 (two) times daily.   Yes Historical Provider, MD    Family History Family History  Problem Relation Age of Onset  . Hypertension Father   . Diabetes Father   . Diabetes Sister   . Diabetes Maternal Grandmother   . Cancer Other   . Diabetes Other     Social History Social History  Substance Use Topics  . Smoking status: Never Smoker  . Smokeless tobacco: Never Used  . Alcohol use No     Allergies   Review of patient's allergies indicates no known allergies.   Review of Systems Review of Systems  Constitutional: Positive for chills. Negative for fever.  HENT: Negative for trouble swallowing.   Eyes: Negative for visual disturbance.  Respiratory: Negative for chest tightness and shortness of breath.   Cardiovascular: Negative for chest pain.  Gastrointestinal: Positive for nausea and vomiting. Negative for abdominal pain, blood in stool and diarrhea.  Endocrine: Positive for polydipsia and polyuria.  Genitourinary: Positive for pelvic pain and vaginal bleeding. Negative for difficulty urinating, dysuria, flank pain, hematuria and vaginal discharge.  Musculoskeletal: Negative for back pain and neck pain.  Skin: Negative for rash.  Neurological: Positive for dizziness and headaches. Negative for syncope, weakness and numbness.     Physical Exam Updated Vital Signs BP 143/67 (BP Location: Left Arm)   Pulse 111  Temp 97.8 F (36.6 C) (Oral)   Resp 19   Ht 5\' 2"  (1.575 m)   Wt 127.9 kg   SpO2 94%   BMI 51.58 kg/m   Physical Exam  Constitutional: She appears well-developed and well-nourished. No distress.  Obese well appearing pt  HENT:  Head: Normocephalic and atraumatic.  Right Ear: Hearing normal. Tympanic membrane is injected. A middle ear effusion is present.  Left Ear: Hearing, tympanic membrane and ear canal normal. Tympanic  membrane is not injected.  No middle ear effusion.  Eyes: Conjunctivae are normal.  Neck: Normal range of motion.  Cardiovascular: Regular rhythm and normal heart sounds.  Tachycardia present.  Exam reveals no gallop and no friction rub.   No murmur heard. Pulses:      Radial pulses are 2+ on the right side, and 2+ on the left side.       Dorsalis pedis pulses are 2+ on the right side, and 2+ on the left side.  Pulmonary/Chest: Effort normal and breath sounds normal. No respiratory distress. She has no decreased breath sounds. She has no wheezes. She has no rhonchi. She has no rales.  Abdominal: Normal appearance and bowel sounds are normal. There is generalized tenderness. There is no rigidity and no rebound.  Obese abdomin, generalized tenderness on exam  Genitourinary:  Genitourinary Comments: Exam performed by Kalman Drape,  exam chaperoned Date: 10/22/2015 Pelvic exam: normal external genitalia without evidence of trauma. VULVA: normal appearing vulva with no masses, tenderness or lesion. VAGINA: normal appearing vagina with normal color and discharge, no lesions. CERVIX: unable to visualize cervix due to body habitus, cervical motion tenderness absent, vaginal discharge - copious, bloody and malodorous, Wet prep and DNA probe for chlamydia and GC obtained.   ADNEXA: unable to appreciate due to body habitus, nontender on the right with mild tenderness on the left, and no masses appreciated  UTERUS: uterus is unable to fully appreciate due to body habitus, nontender.    Musculoskeletal: Normal range of motion. She exhibits no edema.  Neurological: She is alert. Coordination normal.  Skin: Skin is warm and dry. No rash noted. She is not diaphoretic.  Psychiatric: She has a normal mood and affect. Her behavior is normal.  Nursing note and vitals reviewed.    ED Treatments / Results  Labs (all labs ordered are listed, but only abnormal results are displayed) Labs Reviewed  CBC  WITH DIFFERENTIAL/PLATELET - Abnormal; Notable for the following:       Result Value   WBC 13.2 (*)    Hemoglobin 15.2 (*)    Neutro Abs 9.9 (*)    All other components within normal limits  CBG MONITORING, ED - Abnormal; Notable for the following:    Glucose-Capillary 371 (*)    All other components within normal limits  WET PREP, GENITAL  COMPREHENSIVE METABOLIC PANEL  RPR  HIV ANTIBODY (ROUTINE TESTING)  URINALYSIS, ROUTINE W REFLEX MICROSCOPIC (NOT AT [facility])  I-STAT BETA HCG BLOOD, ED ([facility], [facility], [facility] ONLY)  GC/CHLAMYDIA PROBE AMP ([place]) NOT AT [facility]    EKG  EKG Interpretation None       Radiology No results found.  Procedures Procedures (including critical care time)  Medications Ordered in ED Medications  sodium chloride 0.9 % bolus 1,000 mL (1,000 mLs Intravenous New Bag/Given 10/22/15 2303)  ondansetron (ZOFRAN) injection 4 mg (4 mg Intravenous Given 10/22/15 2303)     Initial Impression / Assessment and Plan / ED Course  I have  reviewed the triage vital signs and the nursing notes.  Pertinent labs & imaging results that were available during my care of the patient were reviewed by me and considered in my medical decision making (see chart for details).  Clinical Course   Patient is nontoxic, nonseptic appearing, in no apparent distress.  Patient's pain and other symptoms adequately managed in emergency department.  Fluid bolus given.  Labs, imaging and vitals reviewed.    Patient presents with headache, nausea, vomiting, abdominal pain, vaginal bleeding. Pt was given fluids and Zofran in the ED and headache and nausea resolved. Patient had fluid behind his right eardrum was mildly injected TM. His headache is likely 2/2 the fluid in his middle right ear as he is stated this has happened before.   Wet prep revealed bacterial vaginosis. Patient discharged with Flagyl. Patient with tenderness in the left adnexal region on pelvic exam. Ultrasound of pelvis was  ordered. I spoke with ultrasound tech who was unable to visualize either ovary due to overlying bowel. Patient afebrile. Mild hypokalemia. Patient was given 40 mEq of potassium by mouth in the ED. Mild leukocytosis. Due to leukocytosis and inability to properly assess abdomen or ovaries due to body habitus I feel CT of abdomen and pelvis was indicated to rule out appendicitis or ovarian abnormality. CT scan pending.   Patient will be signed out to [employer], PA-C at change of shift. She will review the results of the CT scan and determine the pt's disposition. If CT scan is negative, will discharge patient with symptomatic treatment with zofran and strict f/u with OB/GYN for further evaluation of vaginal bleeding.   Is reassuring and the patient's headache and nausea resolved in the ED with fluids and Zofran. I feel that the patient's symptoms could likely be due to menstruation that he has not experiencing in roughly 10 years or a GI virus given mild leukocytosis. Patient does not meet Sirs or sepsis criteria.  Pt case discussed with Dr. Billy Fischer who agrees with the above plan.    Final Clinical Impressions(s) / ED Diagnoses   Final diagnoses:  None    New Prescriptions New Prescriptions   No medications on file     Kalman Drape, PA 10/23/15 [place], PA 10/23/15 0147    Kalman Drape, PA 10/23/15 [id number]    Gareth Morgan, MD 10/26/15 0010

## 2015-10-22 NOTE — ED Triage Notes (Addendum)
Pt complains of headache, nausea, dizziness since waking up at [facility] today. Pt states he almost passed out at dinner this evening. Pt states he has been sleeping for most of the day, which is not normal for him.

## 2015-10-23 ENCOUNTER — Emergency Department (HOSPITAL_COMMUNITY): Payer: Self-pay

## 2015-10-23 LAB — I-STAT BETA HCG BLOOD, ED (MC, WL, AP ONLY)

## 2015-10-23 LAB — MAGNESIUM: Magnesium: 1.8 mg/dL (ref 1.7–2.4)

## 2015-10-23 LAB — CBG MONITORING, ED: Glucose-Capillary: 337 mg/dL — ABNORMAL HIGH (ref 65–99)

## 2015-10-23 LAB — RPR: RPR: NONREACTIVE

## 2015-10-23 LAB — HIV ANTIBODY (ROUTINE TESTING W REFLEX): HIV SCREEN 4TH GENERATION: NONREACTIVE

## 2015-10-23 MED ORDER — DIPHENHYDRAMINE HCL 50 MG/ML IJ SOLN
25.0000 mg | Freq: Once | INTRAMUSCULAR | Status: AC
  Administered 2015-10-23: 25 mg via INTRAVENOUS
  Filled 2015-10-23: qty 1

## 2015-10-23 MED ORDER — METRONIDAZOLE 500 MG PO TABS: 500.0000 mg | ORAL_TABLET | Freq: Two times a day (BID) | ORAL | 0 refills | Status: DC

## 2015-10-23 MED ORDER — PROMETHAZINE HCL 25 MG PO TABS: 25.0000 mg | ORAL_TABLET | Freq: Four times a day (QID) | ORAL | 0 refills | Status: DC | PRN

## 2015-10-23 MED ORDER — IOPAMIDOL (ISOVUE-300) INJECTION 61%
100.0000 mL | Freq: Once | INTRAVENOUS | Status: AC | PRN
  Administered 2015-10-23: 100 mL via INTRAVENOUS

## 2015-10-23 MED ORDER — MAGNESIUM SULFATE 2 GM/50ML IV SOLN
2.0000 g | Freq: Once | INTRAVENOUS | Status: AC
  Administered 2015-10-23: 2 g via INTRAVENOUS
  Filled 2015-10-23: qty 50

## 2015-10-23 MED ORDER — BUTALBITAL-APAP-CAFFEINE 50-325-40 MG PO TABS: 1.0000 | ORAL_TABLET | Freq: Four times a day (QID) | ORAL | 0 refills | Status: DC | PRN

## 2015-10-23 MED ORDER — KETOROLAC TROMETHAMINE 30 MG/ML IJ SOLN
30.0000 mg | Freq: Once | INTRAMUSCULAR | Status: AC
  Administered 2015-10-23: 30 mg via INTRAVENOUS
  Filled 2015-10-23: qty 1

## 2015-10-23 MED ORDER — POTASSIUM CHLORIDE CRYS ER 20 MEQ PO TBCR
40.0000 meq | EXTENDED_RELEASE_TABLET | Freq: Once | ORAL | Status: AC
  Administered 2015-10-23: 40 meq via ORAL
  Filled 2015-10-23: qty 2

## 2015-10-23 MED ORDER — SODIUM CHLORIDE 0.9 % IV BOLUS (SEPSIS)
1000.0000 mL | Freq: Once | INTRAVENOUS | Status: AC
  Administered 2015-10-23: 1000 mL via INTRAVENOUS

## 2015-10-23 MED ORDER — PROCHLORPERAZINE EDISYLATE 5 MG/ML IJ SOLN
10.0000 mg | Freq: Once | INTRAMUSCULAR | Status: AC
  Administered 2015-10-23: 10 mg via INTRAVENOUS
  Filled 2015-10-23: qty 2

## 2015-10-23 NOTE — ED Provider Notes (Signed)
PROGRESS NOTE                                                                                                                 This is a sign-out from [place] at shift change: Krista Williams is a 25 y.o. transgendered female complaining of headache, nausea vomiting diarrhea. Patient was found to have diffuse nonfocal tenderness palpation on abdominal exam, pelvic exam was performed and positive for clue cells, had some right-sided and left-sided lower abdominal discomfort. Pelvic ultrasound could not image the ovaries secondary to habitus. Will obtain CT to rule out appendicitis.Marland KitchenPlease refer to previous note for full HPI, ROS, PMH and PE.   CT abdomen pelvis negative. Discussed with patient the results and their complaining of a 10 out of 10 headache. States that the headache is bilateral frontal, states that he has issues with migraine but this is more severe typical. Also reporting nausea vomiting diarrhea over the course of several days with no fever but endorsing chills.  LSCTA b/l; Heart is RRR; Abd with mild, diffuse tenderness to palpation, no guarding or reboundor rebound FROM to C-spine. Pt can touch chin to chest without discomfort. No TTP of midline cervical spine. II-Visual fields grossly intact. III/IV/VI-Extraocular movements intact.  Pupils reactive bilaterally. V/VII-Smile symmetric, equal eyebrow raise,  facial sensation intact VIII- Hearing grossly intact IX/X-Normal gag XI-bilateral shoulder shrug XII-midline tongue extension Motor: 5/5 bilaterally with normal tone and bulk Cerebellar: Normal finger-to-nose  and normal heel-to-shin test.   Romberg negative Ambulates with a coordinated gait   Will give headache cocktail, neurologic exam is reassuring and there are no red flags to suggest acute intracranial process, think this is likely just dehydration exacerbating chronic migraine headache.  Patient is tolerating by mouth, after headache cocktail is resolved, patient is  insulin dependent diabetic with no local care, states last A1c was over 9. Case management is consulted to help this patient establish primary care, patient is [facility], will also give OB/GYN follow-up. Repeat abdominal exam remains nonsurgical, tachycardia has resolved after hydration.  Vitals:   10/22/15 2118 10/23/15 0021 10/23/15 0421  BP: 143/67 143/84 142/83  Pulse: 111 103 94  Resp: 19 20 18   Temp: 97.8 F (36.6 C) 98.7 F (37.1 C)   TempSrc: Oral Oral   SpO2: 94% 95% 97%  Weight: 127.9 kg    Height: 5\' 2"  (1.575 m)      Medications  sodium chloride 0.9 % bolus 1,000 mL (0 mLs Intravenous Stopped 10/23/15 0005)  ondansetron (ZOFRAN) injection 4 mg (4 mg Intravenous Given 10/22/15 2303)  potassium chloride SA (K-DUR,KLOR-CON) CR tablet 40 mEq (40 mEq Oral Given 10/23/15 0125)  iopamidol (ISOVUE-300) 61 % injection 100 mL (100 mLs Intravenous Contrast Given 10/23/15 0148)  prochlorperazine (COMPAZINE) injection 10 mg (10 mg Intravenous Given 10/23/15 0250)  diphenhydrAMINE (BENADRYL) injection 25 mg (25 mg Intravenous Given 10/23/15 0252)  magnesium sulfate IVPB 2 g 50 mL (0 g Intravenous Stopped 10/23/15 0422)  ketorolac (TORADOL) 30 MG/ML  injection 30 mg (30 mg Intravenous Given 10/23/15 0254)  sodium chloride 0.9 % bolus 1,000 mL (0 mLs Intravenous Stopped 10/23/15 0421)   Evaluation does not show pathology that would require ongoing emergent intervention or inpatient treatment. Pt is hemodynamically stable and mentating appropriately. Discussed findings and plan with patient/guardian, who agrees with care plan. All questions answered. Return precautions discussed and outpatient follow up given.           Monico Blitz, PA-C 10/23/15 0448    Orpah Greek, MD 10/23/15 [phone]

## 2015-10-23 NOTE — Discharge Instructions (Signed)
Do not hesitate to return to the Emergency Department for any new, worsening or concerning symptoms.   If you do not have a primary care doctor you can establish one at the   [facility]: [place] [id number] [phone]  After you establish care. Let them know you were seen in the emergency room. They must obtain records for further management.    Please follow with your primary care doctor in the next 2 days for a check-up. They must obtain records for further management.   Do not hesitate to return to the Emergency Department for any new, worsening or concerning symptoms.   Return to the emergency room for severely worsening abdominal pain, abdominal pain that localizes to a particular area (especially the right lower part of the belly), pain that persists past 8-10 hours, blood in stool or vomit, severe weakness, fainting, or fever.   Maintain hydration by drinking small amounts of clear fluids frequently, then soft diet, and then advance to a solid diet as tolerated. Avoid foods that are spicy, high in fat or dairy.

## 2015-10-24 ENCOUNTER — Encounter: Payer: Self-pay | Admitting: *Deleted

## 2015-10-24 LAB — GC/CHLAMYDIA PROBE AMP (~~LOC~~) NOT AT ARMC
Chlamydia: NEGATIVE
Neisseria Gonorrhea: NEGATIVE

## 2015-10-24 NOTE — Discharge Planning (Signed)
Per Asencion Partridge at [facility]- there are NO APPOINTMENTS at [facility] at this time.  Asencion Partridge advise to call back 10/3 to see if anything has opened up.

## 2015-11-29 DIAGNOSIS — M25532 Pain in left wrist: Secondary | ICD-10-CM | POA: Insufficient documentation

## 2015-11-29 DIAGNOSIS — E119 Type 2 diabetes mellitus without complications: Secondary | ICD-10-CM | POA: Insufficient documentation

## 2015-11-29 DIAGNOSIS — I10 Essential (primary) hypertension: Secondary | ICD-10-CM | POA: Insufficient documentation

## 2015-11-29 DIAGNOSIS — F840 Autistic disorder: Secondary | ICD-10-CM | POA: Insufficient documentation

## 2015-11-29 DIAGNOSIS — Z794 Long term (current) use of insulin: Secondary | ICD-10-CM | POA: Insufficient documentation

## 2015-11-29 DIAGNOSIS — M25531 Pain in right wrist: Secondary | ICD-10-CM | POA: Insufficient documentation

## 2015-11-29 DIAGNOSIS — H9203 Otalgia, bilateral: Secondary | ICD-10-CM | POA: Insufficient documentation

## 2015-11-30 ENCOUNTER — Emergency Department (HOSPITAL_COMMUNITY)
Admission: EM | Admit: 2015-11-30 | Discharge: 2015-11-30 | Disposition: A | Discharge status: Home / Self Care | Payer: Self-pay | Attending: Emergency Medicine | Admitting: Emergency Medicine

## 2015-11-30 ENCOUNTER — Encounter (HOSPITAL_COMMUNITY): Payer: Self-pay | Admitting: Emergency Medicine

## 2015-11-30 DIAGNOSIS — M25531 Pain in right wrist: Secondary | ICD-10-CM

## 2015-11-30 DIAGNOSIS — M25532 Pain in left wrist: Secondary | ICD-10-CM

## 2015-11-30 DIAGNOSIS — H9203 Otalgia, bilateral: Secondary | ICD-10-CM

## 2015-11-30 HISTORY — DX: Autistic disorder: F84.0

## 2015-11-30 MED ORDER — HYDROCODONE-ACETAMINOPHEN 5-325 MG PO TABS: 1.0000 | ORAL_TABLET | ORAL | 0 refills | Status: DC | PRN

## 2015-11-30 MED ORDER — AMOXICILLIN 500 MG PO CAPS: 500.0000 mg | ORAL_CAPSULE | Freq: Three times a day (TID) | ORAL | 0 refills | Status: DC

## 2015-11-30 MED ORDER — OXYCODONE-ACETAMINOPHEN 5-325 MG PO TABS
1.0000 | ORAL_TABLET | Freq: Once | ORAL | Status: AC
  Administered 2015-11-30: 1 via ORAL
  Filled 2015-11-30: qty 1

## 2015-11-30 MED ORDER — PREDNISONE 20 MG PO TABS
60.0000 mg | ORAL_TABLET | Freq: Once | ORAL | Status: AC
  Administered 2015-11-30: 60 mg via ORAL
  Filled 2015-11-30: qty 3

## 2015-11-30 NOTE — Discharge Instructions (Signed)
Take the prescribed medication as directed. Follow-up with Dr. Amedeo Plenty (hand specialist) if you continue having issues with your wrists. Return to the ED for new or worsening symptoms.

## 2015-11-30 NOTE — ED Triage Notes (Addendum)
Pt has carpal tunnel and usually experiences forearm pain; today both pt's arms began hurting worse than usual; pt has been experiencing decreasing motor control of both hands; pt was unable to grip cell phone and fork this evening; PMS intact

## 2015-11-30 NOTE — ED Notes (Signed)
Pt states he was saving a dog stuck in the drain and has had forearm pain since then. Pt states he doesn't recall hitting it on anything or getting it stuck. Pt does have carpel tunnel in the wrist but states that's only with excess activity or cold weather. Forearm sensitive to touch upon assessment.

## 2015-11-30 NOTE — ED Provider Notes (Signed)
[facility] DEPT Provider Note   CSN: [id number] Arrival date & time: 11/29/15  2359   By signing my name below, I, Macon Large, attest that this documentation has been prepared under the direction and in the presence of Quincy Carnes, PA-C Electronically Signed: Macon Large, ED Scribe. 11/30/15. 1:38 AM.  History   Chief Complaint Chief Complaint  Patient presents with  . Arm Pain   The history is provided by the patient and the spouse. No language interpreter was used.   HPI Comments: Krista Williams is a 25 y.o. female with Hx of carpal tunnel, who presents to the Emergency Department complaining of 9/10, gradually worsening bilateral forearm pain onset today. States generally has some pain in the wrists due to carpal tunnel, however seems worse today.  No new injury or trauma.  States some difficulty gripping objects today due to pain.  Denies new numbness or weakness of the hands.  Normal sensation throughout hand and all fingers.  Patient is right hand dominant.    Pt also reports fluid in ears bilaterally, notes worsened right ear pain. Per spouse, Pt has been seen for this complaint in the past and was given antibiotics with relief relief. No fever, chills, sweats.  No nasal congestion, sore throat, or other URI symptoms.  Past Medical History:  Diagnosis Date  . Autism   . Diabetes mellitus without complication ([place])   . High cholesterol   . Hypertension     There are no active problems to display for this patient.   History reviewed. No pertinent surgical history.  OB History    No data available       Home Medications    Prior to Admission medications   Medication Sig Start Date End Date Taking? Authorizing Provider  ALPRAZolam Duanne Moron) 0.5 MG tablet Take 0.5 mg by mouth 3 (three) times daily as needed for anxiety.    Historical Provider, MD  butalbital-acetaminophen-caffeine (FIORICET, ESGIC) 50-325-40 MG tablet Take 1 tablet by mouth every 6  (six) hours as needed for headache. 10/23/15   Nicole Pisciotta, PA-C  citalopram (CELEXA) 40 MG tablet Take 40 mg by mouth daily.    Historical Provider, MD  hydrochlorothiazide (HYDRODIURIL) 25 MG tablet Take 1 tablet (25 mg total) by mouth daily. 12/10/12   Reyne Dumas, MD  ibuprofen (ADVIL,MOTRIN) 200 MG tablet Take 1,600 mg by mouth every 8 (eight) hours as needed for moderate pain.    Historical Provider, MD  insulin glargine (LANTUS) 100 UNIT/ML injection Inject 30 Units into the skin 2 (two) times daily.    Historical Provider, MD  metroNIDAZOLE (FLAGYL) 500 MG tablet Take 1 tablet (500 mg total) by mouth 2 (two) times daily. 10/23/15   Kalman Drape, PA  promethazine (PHENERGAN) 25 MG tablet Take 1 tablet (25 mg total) by mouth every 6 (six) hours as needed for nausea or vomiting. 10/23/15   Elmyra Ricks Pisciotta, PA-C    Family History Family History  Problem Relation Age of Onset  . Hypertension Father   . Diabetes Father   . Diabetes Sister   . Diabetes Maternal Grandmother   . Cancer Other   . Diabetes Other     Social History Social History  Substance Use Topics  . Smoking status: Never Smoker  . Smokeless tobacco: Never Used  . Alcohol use No     Allergies   Metformin and related and Victoza [liraglutide]   Review of Systems Review of Systems  Constitutional: Negative for fever.  HENT: Positive  for ear pain.   Musculoskeletal: Positive for arthralgias and myalgias (forearm ).  All other systems reviewed and are negative.    Physical Exam Updated Vital Signs BP 141/87 (BP Location: Right Arm)   Pulse 101   Temp 98.1 F (36.7 C) (Oral)   Resp 20   Ht 5\' 2"  (1.575 m)   Wt 280 lb (127 kg)   SpO2 98%   BMI 51.21 kg/m   Physical Exam  Constitutional: She is oriented to person, place, and time. She appears well-developed and well-nourished.  HENT:  Head: Normocephalic and atraumatic.  Mouth/Throat: Oropharynx is clear and moist.  Bilateral middle ear  effusions, EAC's mildly erythematous, no signs of TM rupture or mastoiditis  Eyes: Conjunctivae and EOM are normal. Pupils are equal, round, and reactive to light.  Neck: Normal range of motion.  Cardiovascular: Normal rate, regular rhythm and normal heart sounds.   Pulmonary/Chest: Effort normal and breath sounds normal.  Abdominal: Soft. Bowel sounds are normal.  Musculoskeletal: Normal range of motion.  Bilateral wrists with + Tinel's signs; no gross bony deformities or significant swelling noted; no overlying skin changes or warmth to touch; normal grip strength bilaterally; able to grip cell phone and call bell during exam without apparent difficulty  Neurological: She is alert and oriented to person, place, and time.  Skin: Skin is warm and dry.  Psychiatric: She has a normal mood and affect.  Nursing note and vitals reviewed.    ED Treatments / Results   DIAGNOSTIC STUDIES: Oxygen Saturation is 98% on RA, normal by my interpretation.    COORDINATION OF CARE: 1:20 AM Discussed treatment plan with pt at bedside which includes Prednisone, hand specialist and pt agreed to plan.  Labs (all labs ordered are listed, but only abnormal results are displayed) Labs Reviewed - No data to display  EKG  EKG Interpretation None       Radiology No results found.  Procedures Procedures (including critical care time)  Medications Ordered in ED Medications  oxyCODONE-acetaminophen (PERCOCET/ROXICET) 5-325 MG per tablet 1 tablet (1 tablet Oral Given 11/30/15 0143)  predniSONE (DELTASONE) tablet 60 mg (60 mg Oral Given 11/30/15 0143)     Initial Impression / Assessment and Plan / ED Course  I have reviewed the triage vital signs and the nursing notes.  Pertinent labs & imaging results that were available during my care of the patient were reviewed by me and considered in my medical decision making (see chart for details).  Clinical Course    25 year old female identified as  female, here with bilateral wrist pain. Reports difficulty with hand movements today due to pain. No gross deformities noted on exam. No appreciable swelling. No overlying skin changes reports to touch. Positive Tinel's sign bilaterally.  Able to grip cell phone and call bell during exam without difficulty. Normal grip strength on my exam neurologic as well. Both hands and arms are neurovascularly intact. Suspect this is related to carpal tunnel.  Low suspicion for acute central neurologic process given rather benign exam and symptoms occurring bilaterally. Has not been wearing wrist braces as directed as these were left behind after they moved to [facility]. Given new braces here. Will refer to hand surgery for follow-up.  Rx vicodin, prednisone.  Patient also complains of bilateral ear pain. Reports history of "fluid in the ears".  Does have middle ear effusions on exam. EACs are mildly erythematous. No signs of TM rupture or mastoiditis. Will start on amoxicillin.  Recommended to  follow-up with PCP.  Discussed plan with patient, he acknowledged understanding and agreed with plan of care.  Return precautions given for new or worsening symptoms.  Final Clinical Impressions(s) / ED Diagnoses   Final diagnoses:  Bilateral wrist pain  Otalgia of both ears    New Prescriptions Discharge Medication List as of 11/30/2015  3:02 AM    START taking these medications   Details  amoxicillin (AMOXIL) 500 MG capsule Take 1 capsule (500 mg total) by mouth 3 (three) times daily., Starting Wed 11/30/2015, Print    HYDROcodone-acetaminophen (NORCO/VICODIN) 5-325 MG tablet Take 1 tablet by mouth every 4 (four) hours as needed., Starting Wed 11/30/2015, Print       I personally performed the services described in this documentation, which was scribed in my presence. The recorded information has been reviewed and is accurate.    Larene Pickett, PA-C 11/30/15 [place], MD 12/03/15  [phone]

## 2015-12-02 ENCOUNTER — Emergency Department (HOSPITAL_COMMUNITY)
Admission: EM | Admit: 2015-12-02 | Discharge: 2015-12-02 | Disposition: A | Discharge status: Home / Self Care | Payer: Self-pay | Attending: Emergency Medicine | Admitting: Emergency Medicine

## 2015-12-02 ENCOUNTER — Encounter (HOSPITAL_COMMUNITY): Payer: Self-pay | Admitting: Emergency Medicine

## 2015-12-02 DIAGNOSIS — F840 Autistic disorder: Secondary | ICD-10-CM | POA: Insufficient documentation

## 2015-12-02 DIAGNOSIS — M79632 Pain in left forearm: Secondary | ICD-10-CM | POA: Insufficient documentation

## 2015-12-02 DIAGNOSIS — M79601 Pain in right arm: Secondary | ICD-10-CM

## 2015-12-02 DIAGNOSIS — I10 Essential (primary) hypertension: Secondary | ICD-10-CM | POA: Insufficient documentation

## 2015-12-02 DIAGNOSIS — Z79899 Other long term (current) drug therapy: Secondary | ICD-10-CM | POA: Insufficient documentation

## 2015-12-02 DIAGNOSIS — E119 Type 2 diabetes mellitus without complications: Secondary | ICD-10-CM | POA: Insufficient documentation

## 2015-12-02 DIAGNOSIS — M79631 Pain in right forearm: Secondary | ICD-10-CM | POA: Insufficient documentation

## 2015-12-02 DIAGNOSIS — Z794 Long term (current) use of insulin: Secondary | ICD-10-CM | POA: Insufficient documentation

## 2015-12-02 DIAGNOSIS — M79602 Pain in left arm: Secondary | ICD-10-CM

## 2015-12-02 MED ORDER — NAPROXEN 500 MG PO TABS: 500.0000 mg | ORAL_TABLET | Freq: Two times a day (BID) | ORAL | 0 refills | Status: DC

## 2015-12-02 NOTE — ED Provider Notes (Signed)
[facility] DEPT Provider Note   CSN: [id number] Arrival date & time: 12/02/15  0005 By signing my name below, I, Doran Stabler, attest that this documentation has been prepared under the direction and in the presence of PA. Electronically Signed: Doran Stabler, ED Scribe. 12/02/15. 12:38 AM.   History   Chief Complaint Chief Complaint  Patient presents with  . Arm Pain    The history is provided by the patient. No language interpreter was used.   HPI Comments: Krista Williams is a 25 y.o. female who presents to the Emergency Department with a PMHx DM and HTN complaining of bilateral forearm pain that began 2 days ago but worsened today. Pt states he picked up large metal crate 2 days ago and noted pain to his forearm later that day. He was seen in the ED for that pain and associated swelling 2 days ago and DC with Hydrocodone and wrist splints. Today, pt arrives will unchanged symptoms. He reports continued bilateral swelling, bruising, weakness due to pain. Pt denies any aggravating or alleviating factors.Pt has been wearing bilateral wrist splints since his first visit and only takes it off when it gets too itchy or when hes sleeping. He has been taking Aleve and Tylenol with no relief. Pt denies any fevers, chills, CP, SOB, N/V/D, numbness,  any other symptoms at this time. Denies any other recent injury.  Past Medical History:  Diagnosis Date  . Autism   . Diabetes mellitus without complication ([place])   . High cholesterol   . Hypertension    There are no active problems to display for this patient.  History reviewed. No pertinent surgical history.  OB History    No data available     Home Medications    Prior to Admission medications   Medication Sig Start Date End Date Taking? Authorizing Provider  ALPRAZolam Duanne Moron) 0.5 MG tablet Take 0.5 mg by mouth 3 (three) times daily as needed for anxiety.    Historical Provider, MD  amoxicillin (AMOXIL) 500 MG capsule Take 1  capsule (500 mg total) by mouth 3 (three) times daily. 11/30/15   Larene Pickett, PA-C  butalbital-acetaminophen-caffeine (FIORICET, ESGIC) [phone] MG tablet Take 1 tablet by mouth every 6 (six) hours as needed for headache. 10/23/15   Nicole Pisciotta, PA-C  citalopram (CELEXA) 40 MG tablet Take 40 mg by mouth daily.    Historical Provider, MD  hydrochlorothiazide (HYDRODIURIL) 25 MG tablet Take 1 tablet (25 mg total) by mouth daily. 12/10/12   Reyne Dumas, MD  HYDROcodone-acetaminophen (NORCO/VICODIN) 5-325 MG tablet Take 1 tablet by mouth every 4 (four) hours as needed. 11/30/15   Larene Pickett, PA-C  ibuprofen (ADVIL,MOTRIN) 200 MG tablet Take 1,600 mg by mouth every 8 (eight) hours as needed for moderate pain.    Historical Provider, MD  insulin glargine (LANTUS) 100 UNIT/ML injection Inject 30 Units into the skin 2 (two) times daily.    Historical Provider, MD  metroNIDAZOLE (FLAGYL) 500 MG tablet Take 1 tablet (500 mg total) by mouth 2 (two) times daily. 10/23/15   Kalman Drape, PA  naproxen (NAPROSYN) 500 MG tablet Take 1 tablet (500 mg total) by mouth 2 (two) times daily. 12/02/15   Nona Dell, PA-C  promethazine (PHENERGAN) 25 MG tablet Take 1 tablet (25 mg total) by mouth every 6 (six) hours as needed for nausea or vomiting. 10/23/15   Elmyra Ricks Pisciotta, PA-C   Family History Family History  Problem Relation Age of Onset  . Hypertension  Father   . Diabetes Father   . Diabetes Sister   . Diabetes Maternal Grandmother   . Cancer Other   . Diabetes Other    Social History Social History  Substance Use Topics  . Smoking status: Never Smoker  . Smokeless tobacco: Never Used  . Alcohol use No    Allergies   Metformin and related and Victoza [liraglutide]  Review of Systems Review of Systems  Constitutional: Negative for chills and fever.  Respiratory: Negative for shortness of breath.   Cardiovascular: Negative for chest pain.  Gastrointestinal: Negative for  diarrhea, nausea and vomiting.  Musculoskeletal: Positive for myalgias.  Neurological: Negative for numbness.  All other systems reviewed and are negative.  Physical Exam Updated Vital Signs BP 153/80   Pulse 104   Temp 98.2 F (36.8 C) (Oral)   Resp 20   SpO2 96%   Physical Exam  Constitutional: She is oriented to person, place, and time. She appears well-developed and well-nourished.  HENT:  Head: Normocephalic and atraumatic.  Eyes: Conjunctivae and EOM are normal. Right eye exhibits no discharge. Left eye exhibits no discharge. No scleral icterus.  Neck: Normal range of motion. Neck supple.  Cardiovascular: Normal rate and intact distal pulses.   HR 96  Pulmonary/Chest: Effort normal.  Musculoskeletal:  Mild tenderness to the bilateral forearm and wrists. Positive Tinel's bilaterally. Full ROM of bilateral fingers, hands, wrists, forearms, elbows and shoulders with 5/5 strength. Equal grip strength bilaterally. 2+ radial pulse. Sensation grossly intact. Cap refill less that 2 sec. No swelling, erythema, ecchymosis, or warmth noted.   Neurological: She is alert and oriented to person, place, and time.  Skin: Skin is warm and dry. Capillary refill takes less than 2 seconds.  Psychiatric: She has a normal mood and affect.  Nursing note and vitals reviewed.  ED Treatments / Results  DIAGNOSTIC STUDIES: Oxygen Saturation is 96% on room air, normal by my interpretation.    COORDINATION OF CARE: 12:49 AM Discussed treatment plan with pt at bedside and pt agreed to plan.  Labs (all labs ordered are listed, but only abnormal results are displayed) Labs Reviewed - No data to display  EKG  EKG Interpretation None       Radiology No results found.  Procedures Procedures (including critical care time)  Medications Ordered in ED Medications - No data to display   Initial Impression / Assessment and Plan / ED Course  I have reviewed the triage vital signs and the  nursing notes.  Pertinent labs & imaging results that were available during my care of the patient were reviewed by me and considered in my medical decision making (see chart for details).  Clinical Course     Patient presents with continued bilateral wrist/forearm pain. Reports picking up a large metal crate 2 days ago prior to onset of symptoms. He reports he was seen in the ED that day and was discharged home with pain meds and wrist splints. Denies relief of symptoms. Denies any other recent injury. VSS. Exam revealed mild tenderness over bilateral wrists and forearms with no obvious deformity. Positive Tinel's bilaterally. Remaining exam unremarkable, bilateral upper extremities neurovascularly intact with equal grip strength. Suspect patient's symptoms are likely due to carpal tunnel syndrome and do not feel that any further workup or imaging is warranted at this time. Advised patient to continue wearing his wrist splints and patient given prescription for naproxen. Discussed symptomatic treatment. Patient given information to follow-up with orthopedics outpatient if his symptoms  have not improved. Discussed return precautions.  Final Clinical Impressions(s) / ED Diagnoses   Final diagnoses:  Pain in both upper extremities    New Prescriptions New Prescriptions   NAPROXEN (NAPROSYN) 500 MG TABLET    Take 1 tablet (500 mg total) by mouth 2 (two) times daily.   I personally performed the services described in this documentation, which was scribed in my presence. The recorded information has been reviewed and is accurate.     Chesley Noon [place], [place] [id number]    Delora Fuel, MD [id number]

## 2015-12-02 NOTE — ED Triage Notes (Signed)
Pt is c/o bilateral forearm pain  Pt was seen here for same a couple days ago  Pt states the pain has not improved, the swelling has gotten worse and states she is now having bruising  Pt has bilateral wrist splints in place

## 2015-12-02 NOTE — ED Notes (Signed)
PA at bedside.

## 2015-12-02 NOTE — Discharge Instructions (Signed)
I recommend continuing to take your anti-inflammatory as prescribed. I also recommend continuing to where your wrist splint daily. He should also rest, elevate and apply ice to your forearms for 15 minutes 3-4 times daily to help with pain and swelling. I recommend following up with orthopedic clinic listed below if her pain has not improved over the next week. Please return to the Emergency Department if symptoms worsen or new onset of fever, redness, swelling, warmth, decreased range of motion, numbness, tingling, weakness.

## 2015-12-06 ENCOUNTER — Emergency Department (HOSPITAL_COMMUNITY)
Admission: EM | Admit: 2015-12-06 | Discharge: 2015-12-07 | Disposition: A | Discharge status: Home / Self Care | Payer: Self-pay | Attending: Physician Assistant | Admitting: Physician Assistant

## 2015-12-06 ENCOUNTER — Encounter (HOSPITAL_COMMUNITY): Payer: Self-pay

## 2015-12-06 DIAGNOSIS — E119 Type 2 diabetes mellitus without complications: Secondary | ICD-10-CM | POA: Insufficient documentation

## 2015-12-06 DIAGNOSIS — Z79899 Other long term (current) drug therapy: Secondary | ICD-10-CM | POA: Insufficient documentation

## 2015-12-06 DIAGNOSIS — Z794 Long term (current) use of insulin: Secondary | ICD-10-CM | POA: Insufficient documentation

## 2015-12-06 DIAGNOSIS — F840 Autistic disorder: Secondary | ICD-10-CM | POA: Insufficient documentation

## 2015-12-06 DIAGNOSIS — Y999 Unspecified external cause status: Secondary | ICD-10-CM | POA: Insufficient documentation

## 2015-12-06 DIAGNOSIS — X58XXXA Exposure to other specified factors, initial encounter: Secondary | ICD-10-CM | POA: Insufficient documentation

## 2015-12-06 DIAGNOSIS — Y939 Activity, unspecified: Secondary | ICD-10-CM | POA: Insufficient documentation

## 2015-12-06 DIAGNOSIS — M25531 Pain in right wrist: Secondary | ICD-10-CM | POA: Insufficient documentation

## 2015-12-06 DIAGNOSIS — Y929 Unspecified place or not applicable: Secondary | ICD-10-CM | POA: Insufficient documentation

## 2015-12-06 DIAGNOSIS — I10 Essential (primary) hypertension: Secondary | ICD-10-CM | POA: Insufficient documentation

## 2015-12-06 NOTE — ED Triage Notes (Signed)
Pt is c/o right forearm pain  Pt was seen here for same a couple days ago  Pt states the pain has increased and describes his pain as "crushing bone pain". Pt denies traumatic injury.

## 2015-12-07 ENCOUNTER — Emergency Department (HOSPITAL_COMMUNITY): Payer: Self-pay

## 2015-12-07 MED ORDER — HYDROCODONE-ACETAMINOPHEN 5-325 MG PO TABS: 1.0000 | ORAL_TABLET | Freq: Four times a day (QID) | ORAL | 0 refills | Status: DC | PRN

## 2015-12-07 MED ORDER — HYDROCODONE-ACETAMINOPHEN 5-325 MG PO TABS
1.0000 | ORAL_TABLET | Freq: Once | ORAL | Status: AC
  Administered 2015-12-07: 1 via ORAL
  Filled 2015-12-07: qty 1

## 2015-12-07 NOTE — Discharge Instructions (Signed)
Please keep the splint on. Please rest, ice, elevated. Please follow up with ortho if pain persists. I am giving you pain medicine that has tylenol. Do not take more tylenol than prescribed. Continue with the naproxen.

## 2015-12-07 NOTE — ED Notes (Signed)
Ortho tech at bedside 

## 2015-12-07 NOTE — Progress Notes (Addendum)
Orthopedic Tech Progress Note Patient Details:  Krista Williams 1990/03/23 [id number]  Ortho Devices Type of Ortho Device: Ulna gutter splint Ortho Device/Splint Location: rue Ortho Device/Splint Interventions: Ordered, Application Applied ulna gutter splint as per drs verbal order.  Karolee Stamps 12/07/2015, 1:27 AM

## 2015-12-07 NOTE — ED Provider Notes (Signed)
[facility] DEPT Provider Note   CSN: [id number] Arrival date & time: 12/06/15  2242     History   Chief Complaint Chief Complaint  Patient presents with  . Right Forearm Pain    HPI Krista Williams is a 25 y.o. female.  25 year old Caucasian female with past medical history significant for diabetes presents to the ED today with right wrist pain. Patient has been seen twice in the past 2 weeks for same. Patient states he tried rescue puppy from a storm drain 2 weeks ago. She's had bilateral wrist pain since. The patient was diagnosed with possible carpal tunnel in the ED on 11/4. She was put into bilateral wrist splints and given a small dose of pain medicine. Patient was referred orthopedist but has not followed up due to insurance issues. Patient states her left wrist pain has resolved. Continues to endorse right wrist pain. Endorses edema and bruising to the distal end of the ulna. She's been trying naproxen at home that has not helped. Moving makes the pain worse. Nothing makes pain better. She denies any other complaints.       Past Medical History:  Diagnosis Date  . Autism   . Diabetes mellitus without complication ([place])   . High cholesterol   . Hypertension     There are no active problems to display for this patient.   History reviewed. No pertinent surgical history.  OB History    No data available       Home Medications    Prior to Admission medications   Medication Sig Start Date End Date Taking? Authorizing Provider  ALPRAZolam Duanne Moron) 0.5 MG tablet Take 0.5 mg by mouth 3 (three) times daily as needed for anxiety.    Historical Provider, MD  amoxicillin (AMOXIL) 500 MG capsule Take 1 capsule (500 mg total) by mouth 3 (three) times daily. 11/30/15   Larene Pickett, PA-C  butalbital-acetaminophen-caffeine (FIORICET, ESGIC) [phone] MG tablet Take 1 tablet by mouth every 6 (six) hours as needed for headache. 10/23/15   Nicole Pisciotta, PA-C  citalopram  (CELEXA) 40 MG tablet Take 40 mg by mouth daily.    Historical Provider, MD  hydrochlorothiazide (HYDRODIURIL) 25 MG tablet Take 1 tablet (25 mg total) by mouth daily. 12/10/12   Reyne Dumas, MD  HYDROcodone-acetaminophen (NORCO) 5-325 MG tablet Take 1 tablet by mouth every 6 (six) hours as needed. 12/07/15   Doristine Devoid, PA-C  ibuprofen (ADVIL,MOTRIN) 200 MG tablet Take 1,600 mg by mouth every 8 (eight) hours as needed for moderate pain.    Historical Provider, MD  insulin glargine (LANTUS) 100 UNIT/ML injection Inject 30 Units into the skin 2 (two) times daily.    Historical Provider, MD  metroNIDAZOLE (FLAGYL) 500 MG tablet Take 1 tablet (500 mg total) by mouth 2 (two) times daily. 10/23/15   Kalman Drape, PA  naproxen (NAPROSYN) 500 MG tablet Take 1 tablet (500 mg total) by mouth 2 (two) times daily. 12/02/15   Nona Dell, PA-C  promethazine (PHENERGAN) 25 MG tablet Take 1 tablet (25 mg total) by mouth every 6 (six) hours as needed for nausea or vomiting. 10/23/15   Elmyra Ricks Pisciotta, PA-C    Family History Family History  Problem Relation Age of Onset  . Hypertension Father   . Diabetes Father   . Diabetes Sister   . Diabetes Maternal Grandmother   . Cancer Other   . Diabetes Other     Social History Social History  Substance Use Topics  .  Smoking status: Never Smoker  . Smokeless tobacco: Never Used  . Alcohol use No     Allergies   Metformin and related and Victoza [liraglutide]   Review of Systems Review of Systems  Constitutional: Negative for chills and fever.  HENT: Negative for congestion.   Respiratory: Negative for shortness of breath.   Cardiovascular: Negative for chest pain.  Gastrointestinal: Negative for abdominal pain.  Musculoskeletal: Positive for arthralgias and joint swelling.  Skin: Negative.   Neurological: Negative for dizziness, weakness and numbness.     Physical Exam Updated Vital Signs BP 141/99 (BP Location: Left  Arm)   Pulse 110   Temp 97.6 F (36.4 C) (Oral)   Resp 18   SpO2 99%   Physical Exam  Constitutional: She appears well-developed and well-nourished. No distress.  Eyes: Right eye exhibits no discharge. Left eye exhibits no discharge. No scleral icterus.  Cardiovascular:  Pulses:      Radial pulses are 2+ on the right side, and 2+ on the left side.  Pulmonary/Chest: No respiratory distress.  Musculoskeletal:       Right wrist: She exhibits decreased range of motion (due to pain), tenderness and bony tenderness (over the distal ulna). She exhibits no swelling, no effusion, no crepitus and no deformity.  Strength 5/5. Cap refill normal. Sensation intact. Full ROM of fingers. No deformity or crepitus noted.  Neurological: She is alert.  Skin: Skin is warm and dry. Capillary refill takes less than 2 seconds. No pallor.  Nursing note and vitals reviewed.    ED Treatments / Results  Labs (all labs ordered are listed, but only abnormal results are displayed) Labs Reviewed - No data to display  EKG  EKG Interpretation None       Radiology Dg Wrist Complete Right  Result Date: 12/07/2015 CLINICAL DATA:  Initial evaluation for acute pain at ulnar side of wrist. Status post recent injury. EXAM: RIGHT WRIST - COMPLETE 3+ VIEW COMPARISON:  None. FINDINGS: No acute fracture or dislocation. Corticated osseous density at the tip of the ulnar styloid appears chronic in nature. Normal radiocarpal and distal radioulnar articulations intact. Ulnar minus variance noted. Associated osseous remodeling at the ulnar cortex of the distal radial metaphysis. No appreciable soft tissue swelling about the wrist. Osseous mineralization normal. IMPRESSION: 1. No acute fracture or dislocation. 2. Corticated osseous fragment at the tip of the ulnar styloid, likely related to remote trauma. 3. Ulnar minus variance with early osseous remodeling at the ulnar aspect of the distal radial metaphysis. Changes  potentially can lead to early onset osteoarthritis. Electronically Signed   By: Jeannine Boga M.D.   On: 12/07/2015 00:25    Procedures Procedures (including critical care time)  Medications Ordered in ED Medications  HYDROcodone-acetaminophen (NORCO/VICODIN) 5-325 MG per tablet 1 tablet (1 tablet Oral Given 12/07/15 0003)     Initial Impression / Assessment and Plan / ED Course  I have reviewed the triage vital signs and the nursing notes.  Pertinent labs & imaging results that were available during my care of the patient were reviewed by me and considered in my medical decision making (see chart for details).  Clinical Course   Patient presents with right wrist pain. Xray showed corticated osseous fragment at the tip of the ulnar styloid, likely related to remote trauma. Findings were dscussed with patient. I have ordered an ulnar gutter splint to be applied for comfort. Small dose of pain medicine given. Encourage patient to continue to use her naproxen at  home. I also encouraged symptomatic treatment including rest, ice, elevation of the arm. I given her referral to orthopedics if her pain persist. Pt is hemodynamically stable, in NAD, & able to ambulate in the ED. Pain has been managed & has no complaints prior to dc. Pt is comfortable with above plan and is stable for discharge at this time. All questions were answered prior to disposition. Strict return precautions for f/u to the ED were discussed.   Final Clinical Impressions(s) / ED Diagnoses   Final diagnoses:  Wrist pain, acute, right    New Prescriptions New Prescriptions   HYDROCODONE-ACETAMINOPHEN (NORCO) 5-325 MG TABLET    Take 1 tablet by mouth every 6 (six) hours as needed.     Doristine Devoid, PA-C 12/07/15 0126    Courteney Julio Alm, MD 12/07/15 [id number]

## 2015-12-07 NOTE — ED Notes (Signed)
Ortho tech paged  

## 2015-12-28 ENCOUNTER — Encounter (HOSPITAL_COMMUNITY): Payer: Self-pay | Admitting: Emergency Medicine

## 2015-12-28 ENCOUNTER — Emergency Department (HOSPITAL_COMMUNITY)
Admission: EM | Admit: 2015-12-28 | Discharge: 2015-12-28 | Disposition: A | Discharge status: Home / Self Care | Payer: Medicaid Other | Attending: Emergency Medicine | Admitting: Emergency Medicine

## 2015-12-28 DIAGNOSIS — H9202 Otalgia, left ear: Secondary | ICD-10-CM | POA: Diagnosis present

## 2015-12-28 DIAGNOSIS — I10 Essential (primary) hypertension: Secondary | ICD-10-CM | POA: Insufficient documentation

## 2015-12-28 DIAGNOSIS — Z794 Long term (current) use of insulin: Secondary | ICD-10-CM | POA: Diagnosis not present

## 2015-12-28 DIAGNOSIS — E119 Type 2 diabetes mellitus without complications: Secondary | ICD-10-CM | POA: Diagnosis not present

## 2015-12-28 DIAGNOSIS — F840 Autistic disorder: Secondary | ICD-10-CM | POA: Diagnosis not present

## 2015-12-28 DIAGNOSIS — H9203 Otalgia, bilateral: Secondary | ICD-10-CM | POA: Insufficient documentation

## 2015-12-28 DIAGNOSIS — Z79899 Other long term (current) drug therapy: Secondary | ICD-10-CM | POA: Diagnosis not present

## 2015-12-28 MED ORDER — CETIRIZINE HCL 10 MG PO TABS: 10.0000 mg | ORAL_TABLET | Freq: Every day | ORAL | 0 refills | Status: DC

## 2015-12-28 MED ORDER — NEOMYCIN-POLYMYXIN-HC 3.5-10000-1 OT SOLN: 4.0000 [drp] | Freq: Four times a day (QID) | OTIC | 0 refills | Status: AC

## 2015-12-28 MED ORDER — FLUTICASONE PROPIONATE 50 MCG/ACT NA SUSP: 1.0000 | Freq: Every day | NASAL | 0 refills | Status: DC

## 2015-12-28 MED ORDER — AMOXICILLIN-POT CLAVULANATE 875-125 MG PO TABS: 1.0000 | ORAL_TABLET | Freq: Two times a day (BID) | ORAL | 0 refills | Status: AC

## 2015-12-28 NOTE — ED Provider Notes (Signed)
[facility] DEPT Provider Note   CSN: [id number] Arrival date & time: 12/28/15  1744     History   Chief Complaint Chief Complaint  Patient presents with  . Otalgia    HPI Krista Williams FTM Krista Williams) Female  HPI   Krista Williams is a 25 y/o FTM presenting with a week of worsening left ear pain. He states that he was given a course of amoxicillin which he has completed, but may have missed some doses along the way when travelling. The pain is worse with eating and he has reduced his oral food intake but has remained hydrated. He also endorses, subjective fever, cold symptoms, myalgias, night sweats, chills, cough, congestion.  Past Medical History:  Diagnosis Date  . Autism   . Diabetes mellitus without complication ([place])   . High cholesterol   . Hypertension     There are no active problems to display for this patient.   History reviewed. No pertinent surgical history.  OB History    No data available       Home Medications    Prior to Admission medications   Medication Sig Start Date End Date Taking? Authorizing Provider  ALPRAZolam Duanne Moron) 0.5 MG tablet Take 0.5 mg by mouth 3 (three) times daily as needed for anxiety.    Historical Provider, MD  amoxicillin (AMOXIL) 500 MG capsule Take 1 capsule (500 mg total) by mouth 3 (three) times daily. 11/30/15   Larene Pickett, PA-C  amoxicillin-clavulanate (AUGMENTIN) 875-125 MG tablet Take 1 tablet by mouth 2 (two) times daily. 12/28/15 01/07/16  Emeline General, PA-C  butalbital-acetaminophen-caffeine (FIORICET, ESGIC) [phone] MG tablet Take 1 tablet by mouth every 6 (six) hours as needed for headache. 10/23/15   Nicole Pisciotta, PA-C  cetirizine (ZYRTEC) 10 MG tablet Take 1 tablet (10 mg total) by mouth daily. 12/28/15   Emeline General, PA-C  citalopram (CELEXA) 40 MG tablet Take 40 mg by mouth daily.    Historical Provider, MD  fluticasone (FLONASE) 50 MCG/ACT nasal spray Place 1 spray into both nostrils daily.  12/28/15   Emeline General, PA-C  hydrochlorothiazide (HYDRODIURIL) 25 MG tablet Take 1 tablet (25 mg total) by mouth daily. 12/10/12   Reyne Dumas, MD  HYDROcodone-acetaminophen (NORCO) 5-325 MG tablet Take 1 tablet by mouth every 6 (six) hours as needed. 12/07/15   Doristine Devoid, PA-C  ibuprofen (ADVIL,MOTRIN) 200 MG tablet Take 1,600 mg by mouth every 8 (eight) hours as needed for moderate pain.    Historical Provider, MD  insulin glargine (LANTUS) 100 UNIT/ML injection Inject 30 Units into the skin 2 (two) times daily.    Historical Provider, MD  metroNIDAZOLE (FLAGYL) 500 MG tablet Take 1 tablet (500 mg total) by mouth 2 (two) times daily. 10/23/15   Kalman Drape, PA  naproxen (NAPROSYN) 500 MG tablet Take 1 tablet (500 mg total) by mouth 2 (two) times daily. 12/02/15   Nona Dell, PA-C  neomycin-polymyxin-hydrocortisone (CORTISPORIN) otic solution Place 4 drops into the left ear 4 (four) times daily. 12/28/15 01/04/16  Emeline General, PA-C  promethazine (PHENERGAN) 25 MG tablet Take 1 tablet (25 mg total) by mouth every 6 (six) hours as needed for nausea or vomiting. 10/23/15   Elmyra Ricks Pisciotta, PA-C    Family History Family History  Problem Relation Age of Onset  . Hypertension Father   . Diabetes Father   . Diabetes Sister   . Diabetes Maternal Grandmother   . Cancer Other   . Diabetes  Other     Social History Social History  Substance Use Topics  . Smoking status: Never Smoker  . Smokeless tobacco: Never Used  . Alcohol use No     Allergies   Metformin and related and Victoza [liraglutide]   Review of Systems Review of Systems  Constitutional: Positive for chills and fever.       Patient reports subjective fevers, night sweats and chills  HENT: Positive for congestion and ear pain. Negative for drooling, ear discharge, sore throat and trouble swallowing.   Eyes: Negative for photophobia, pain, redness and visual disturbance.  Respiratory:  Negative for cough, choking, chest tightness, shortness of breath, wheezing and stridor.   Cardiovascular: Negative for chest pain, palpitations and leg swelling.  Gastrointestinal: Positive for diarrhea and nausea. Negative for abdominal distention, abdominal pain, blood in stool and vomiting.  Genitourinary: Negative for difficulty urinating and dysuria.  Musculoskeletal: Positive for myalgias. Negative for gait problem and neck stiffness.  Skin: Negative for color change, pallor, rash and wound.  Neurological: Negative for dizziness and numbness.     Physical Exam Updated Vital Signs BP (!) 147/104 (BP Location: Left Wrist)   Pulse 99   Temp 98.3 F (36.8 C) (Oral)   Resp 18   SpO2 95%   Physical Exam  Constitutional: She is oriented to person, place, and time. She appears well-developed and well-nourished. No distress.  Patient is non-toxic appearing, sitting comfortably in chair, no acute distress  HENT:  Head: Normocephalic and atraumatic.  Nose: Nose normal.  Mouth/Throat: Oropharynx is clear and moist. No oropharyngeal exudate.  No signs of TM rupture or mastoiditis. Left TM with small area of purulent appearing bulging. Ear canals are erythematous and serous fluid effusion noted in right ear. Patient had tenderness when pressing left tragus and when pulling on the pinna.  Left pinna with scarring from previous piercings  Eyes: Conjunctivae and EOM are normal. Pupils are equal, round, and reactive to light. Right eye exhibits no discharge. Left eye exhibits no discharge.  Neck: Normal range of motion. Neck supple.  Cardiovascular: Normal rate, regular rhythm and normal heart sounds.   Pulmonary/Chest: Effort normal and breath sounds normal. No stridor. No respiratory distress. She has no wheezes. She has no rales. She exhibits no tenderness.  Abdominal: Bowel sounds are normal. She exhibits distension. She exhibits no mass. There is no tenderness. There is no rebound and no  guarding.  Abdomen is firm and patient states that he is normally "bloated" this way.  Musculoskeletal: Normal range of motion. She exhibits no edema or deformity.  Lymphadenopathy:    She has no cervical adenopathy.  Neurological: She is alert and oriented to person, place, and time.  Skin: Skin is warm and dry. No rash noted. She is not diaphoretic. No erythema. No pallor.  Psychiatric: She has a normal mood and affect. Her behavior is normal.  Nursing note and vitals reviewed.    ED Treatments / Results  Labs (all labs ordered are listed, but only abnormal results are displayed) Labs Reviewed - No data to display  EKG  EKG Interpretation None       Radiology No results found.  Procedures Procedures (including critical care time)  Medications Ordered in ED Medications - No data to display   Initial Impression / Assessment and Plan / ED Course  I have reviewed the triage vital signs and the nursing notes.  Pertinent labs & imaging results that were available during my care of the patient  were reviewed by me and considered in my medical decision making (see chart for details).  Clinical Course    25 y/o FTM presenting with worsening ear pain s/p amoxicillin course, with flu-like symptoms including myalgias and worsening left ear pain, and now right ear pain.   On exam, I noted serous effusion in right ear and erythema of both ear canal, no evidence of perforation or mastoiditis. Left TM shows some evidence of purulent fluid at the bottom. Patient is afebrile and non-toxic appearing and has not been taking anything regularly for the pain.  Will discharge home with augmentin given initial  treatment failure and poorly controlled diabetes, cortisporin drops and antihistamine with flonase to reduce congestion and fluid build up.  Provided patient with resources for PCP and discussed importance of following up with primary care for management of chronic  conditions.  Discussed with patient strict return precautions. Patient was advised to return to the emergency department if experiencing any worsening of symptoms including but not limited to fever, nausea/vomiting, sob, c/p or any other concerning symptoms.  Patient was discussed with Clayton Bibles PA-C who agrees with assessment and plan. Final Clinical Impressions(s) / ED Diagnoses   Final diagnoses:  Otalgia of both ears    New Prescriptions Discharge Medication List as of 12/28/2015  9:28 PM    START taking these medications   Details  amoxicillin-clavulanate (AUGMENTIN) 875-125 MG tablet Take 1 tablet by mouth 2 (two) times daily., Starting Wed 12/28/2015, Until Sat 01/07/2016, Print    cetirizine (ZYRTEC) 10 MG tablet Take 1 tablet (10 mg total) by mouth daily., Starting Wed 12/28/2015, Print    fluticasone (FLONASE) 50 MCG/ACT nasal spray Place 1 spray into both nostrils daily., Starting Wed 12/28/2015, Print    neomycin-polymyxin-hydrocortisone (CORTISPORIN) otic solution Place 4 drops into the left ear 4 (four) times daily., Starting Wed 12/28/2015, Until Wed 01/04/2016, Print         Emeline General, PA-C 12/28/15 2256    Duffy Bruce, MD 12/29/15 [phone]

## 2015-12-28 NOTE — ED Triage Notes (Signed)
Per pt, states B/L ear pain for a couple of days-body aches, cold symptoms

## 2016-02-01 ENCOUNTER — Encounter (HOSPITAL_COMMUNITY): Payer: Self-pay

## 2016-02-01 ENCOUNTER — Emergency Department (HOSPITAL_COMMUNITY)
Admission: EM | Admit: 2016-02-01 | Discharge: 2016-02-01 | Disposition: A | Discharge status: Home / Self Care | Payer: Medicaid Other | Attending: Emergency Medicine | Admitting: Emergency Medicine

## 2016-02-01 DIAGNOSIS — J329 Chronic sinusitis, unspecified: Secondary | ICD-10-CM | POA: Insufficient documentation

## 2016-02-01 DIAGNOSIS — F840 Autistic disorder: Secondary | ICD-10-CM | POA: Diagnosis not present

## 2016-02-01 DIAGNOSIS — Z79899 Other long term (current) drug therapy: Secondary | ICD-10-CM | POA: Insufficient documentation

## 2016-02-01 DIAGNOSIS — E1165 Type 2 diabetes mellitus with hyperglycemia: Secondary | ICD-10-CM | POA: Diagnosis not present

## 2016-02-01 DIAGNOSIS — Z794 Long term (current) use of insulin: Secondary | ICD-10-CM | POA: Diagnosis not present

## 2016-02-01 DIAGNOSIS — R739 Hyperglycemia, unspecified: Secondary | ICD-10-CM

## 2016-02-01 DIAGNOSIS — I10 Essential (primary) hypertension: Secondary | ICD-10-CM | POA: Insufficient documentation

## 2016-02-01 LAB — URINALYSIS, ROUTINE W REFLEX MICROSCOPIC
Bilirubin Urine: NEGATIVE
GLUCOSE, UA: 150 mg/dL — AB
Ketones, ur: NEGATIVE mg/dL
Nitrite: NEGATIVE
PH: 5 (ref 5.0–8.0)
PROTEIN: NEGATIVE mg/dL
Specific Gravity, Urine: 1.02 (ref 1.005–1.030)

## 2016-02-01 LAB — BASIC METABOLIC PANEL
Anion gap: 11 (ref 5–15)
BUN: 16 mg/dL (ref 6–20)
CALCIUM: 9.8 mg/dL (ref 8.9–10.3)
CO2: 27 mmol/L (ref 22–32)
CREATININE: 0.7 mg/dL (ref 0.44–1.00)
Chloride: 101 mmol/L (ref 101–111)
GFR calc Af Amer: >60 mL/min (ref >60)
GFR calc non Af Amer: >60 mL/min (ref >60)
GLUCOSE: 209 mg/dL — AB (ref 65–99)
Potassium: 3.6 mmol/L (ref 3.5–5.1)
Sodium: 139 mmol/L (ref 135–145)

## 2016-02-01 LAB — CBG MONITORING, ED: Glucose-Capillary: 189 mg/dL — ABNORMAL HIGH (ref 65–99)

## 2016-02-01 LAB — CBC
HCT: 41.3 % (ref 36.0–46.0)
Hemoglobin: 14.1 g/dL (ref 12.0–15.0)
MCH: 31.1 pg (ref 26.0–34.0)
MCHC: 34.1 g/dL (ref 30.0–36.0)
MCV: 91 fL (ref 78.0–100.0)
PLATELETS: 318 10*3/uL (ref 150–400)
RBC: 4.54 MIL/uL (ref 3.87–5.11)
RDW: 12.8 % (ref 11.5–15.5)
WBC: 16.8 10*3/uL — ABNORMAL HIGH (ref 4.0–10.5)

## 2016-02-01 NOTE — ED Triage Notes (Signed)
Pt complains of being dizzy and blood sugar being elevated, pt states they are out of insulin

## 2016-02-01 NOTE — ED Provider Notes (Signed)
[facility] DEPT Provider Note   CSN: [id number] Arrival date & time: 02/01/16  0108     History   Chief Complaint Chief Complaint  Patient presents with  . Hyperglycemia    HPI Krista Williams is a 26 y.o. female past medical history of diabetes, hypertension, hyperlipidemia, presenting today with multiple complaints. Patient states blood sugar was high today despite taking oral medications. In addition patient has had left ear pain. States that frequently has pain and fluid in the ear as and placed on antibiotics in the past. This comes back every month and patient is concerned for worsening. Patient has had recent congestion, rhinorrhea and cough. Believes they're coming down with a cold. Has not taken any medications for this. There are no further complaints.   10 Systems reviewed and are negative for acute change except as noted in the HPI.      HPI  Past Medical History:  Diagnosis Date  . Autism   . Diabetes mellitus without complication ([place])   . High cholesterol   . Hypertension     There are no active problems to display for this patient.   History reviewed. No pertinent surgical history.  OB History    No data available       Home Medications    Prior to Admission medications   Medication Sig Start Date End Date Taking? Authorizing Provider  ALPRAZolam Duanne Moron) 0.5 MG tablet Take 0.5 mg by mouth 3 (three) times daily as needed for anxiety.    Historical Provider, MD  amoxicillin (AMOXIL) 500 MG capsule Take 1 capsule (500 mg total) by mouth 3 (three) times daily. 11/30/15   Larene Pickett, PA-C  butalbital-acetaminophen-caffeine (FIORICET, ESGIC) [phone] MG tablet Take 1 tablet by mouth every 6 (six) hours as needed for headache. 10/23/15   Nicole Pisciotta, PA-C  cetirizine (ZYRTEC) 10 MG tablet Take 1 tablet (10 mg total) by mouth daily. 12/28/15   Emeline General, PA-C  citalopram (CELEXA) 40 MG tablet Take 40 mg by mouth daily.    Historical  Provider, MD  fluticasone (FLONASE) 50 MCG/ACT nasal spray Place 1 spray into both nostrils daily. 12/28/15   Emeline General, PA-C  hydrochlorothiazide (HYDRODIURIL) 25 MG tablet Take 1 tablet (25 mg total) by mouth daily. 12/10/12   Reyne Dumas, MD  HYDROcodone-acetaminophen (NORCO) 5-325 MG tablet Take 1 tablet by mouth every 6 (six) hours as needed. 12/07/15   Doristine Devoid, PA-C  ibuprofen (ADVIL,MOTRIN) 200 MG tablet Take 1,600 mg by mouth every 8 (eight) hours as needed for moderate pain.    Historical Provider, MD  insulin glargine (LANTUS) 100 UNIT/ML injection Inject 30 Units into the skin 2 (two) times daily.    Historical Provider, MD  metroNIDAZOLE (FLAGYL) 500 MG tablet Take 1 tablet (500 mg total) by mouth 2 (two) times daily. 10/23/15   Kalman Drape, PA  naproxen (NAPROSYN) 500 MG tablet Take 1 tablet (500 mg total) by mouth 2 (two) times daily. 12/02/15   Nona Dell, PA-C  promethazine (PHENERGAN) 25 MG tablet Take 1 tablet (25 mg total) by mouth every 6 (six) hours as needed for nausea or vomiting. 10/23/15   Elmyra Ricks Pisciotta, PA-C    Family History Family History  Problem Relation Age of Onset  . Hypertension Father   . Diabetes Father   . Diabetes Sister   . Diabetes Maternal Grandmother   . Cancer Other   . Diabetes Other     Social History Social  History  Substance Use Topics  . Smoking status: Never Smoker  . Smokeless tobacco: Never Used  . Alcohol use No     Allergies   Metformin and related and Victoza [liraglutide]   Review of Systems Review of Systems   Physical Exam Updated Vital Signs BP 131/79   Pulse 83   Temp 98.5 F (36.9 C) (Oral)   Resp 18   Ht 5\' 2"  (1.575 m)   Wt 293 lb (132.9 kg)   SpO2 95%   BMI 53.59 kg/m   Physical Exam  Constitutional: She is oriented to person, place, and time. She appears well-developed and well-nourished. No distress.  HENT:  Head: Normocephalic and atraumatic.  Right Ear:  External ear normal.  Left Ear: External ear normal.  Nose: Nose normal.  Mouth/Throat: Oropharynx is clear and moist. No oropharyngeal exudate.  Eyes: Conjunctivae and EOM are normal. Pupils are equal, round, and reactive to light. No scleral icterus.  Neck: Normal range of motion. Neck supple. No JVD present. No tracheal deviation present. No thyromegaly present.  Cardiovascular: Normal rate, regular rhythm and normal heart sounds.  Exam reveals no gallop and no friction rub.   No murmur heard. Pulmonary/Chest: Effort normal and breath sounds normal. No respiratory distress. She has no wheezes. She exhibits no tenderness.  Abdominal: Soft. Bowel sounds are normal. She exhibits no distension and no mass. There is no tenderness. There is no rebound and no guarding.  Musculoskeletal: Normal range of motion. She exhibits no edema or tenderness.  Lymphadenopathy:    She has no cervical adenopathy.  Neurological: She is alert and oriented to person, place, and time. No cranial nerve deficit. She exhibits normal muscle tone.  Skin: Skin is warm and dry. No rash noted. No erythema. No pallor.  Nursing note and vitals reviewed.    ED Treatments / Results  Labs (all labs ordered are listed, but only abnormal results are displayed) Labs Reviewed  BASIC METABOLIC PANEL - Abnormal; Notable for the following:       Result Value   Glucose, Bld 209 (*)    All other components within normal limits  CBC - Abnormal; Notable for the following:    WBC 16.8 (*)    All other components within normal limits  URINALYSIS, ROUTINE W REFLEX MICROSCOPIC - Abnormal; Notable for the following:    APPearance HAZY (*)    Glucose, UA 150 (*)    Hgb urine dipstick LARGE (*)    Leukocytes, UA MODERATE (*)    Bacteria, UA RARE (*)    Squamous Epithelial / LPF 0-5 (*)    All other components within normal limits  CBG MONITORING, ED - Abnormal; Notable for the following:    Glucose-Capillary 189 (*)    All other  components within normal limits    EKG  EKG Interpretation None       Radiology No results found.  Procedures Procedures (including critical care time)  Medications Ordered in ED Medications - No data to display   Initial Impression / Assessment and Plan / ED Course  I have reviewed the triage vital signs and the nursing notes.  Pertinent labs & imaging results that were available during my care of the patient were reviewed by me and considered in my medical decision making (see chart for details).  Clinical Course    Patient presents to the emergency department for hyperglycemia and ear pain. Repeat blood sugar is now 180. Evaluation of the ears does not reveal  any infection. Mild hyperglycemia is likely due to recent cold. Patient educated. Advised to follow with primary care for further management. There is a good understanding. Vital signs remain within normal limits and patient is safe for discharge.   Final Clinical Impressions(s) / ED Diagnoses   Final diagnoses:  Sinusitis, unspecified chronicity, unspecified location  Hyperglycemia    New Prescriptions New Prescriptions   No medications on file     Everlene Balls, MD 02/01/16 [id number]

## 2016-02-01 NOTE — ED Triage Notes (Signed)
Pt also complains of left sided earache

## 2016-03-31 ENCOUNTER — Ambulatory Visit (INDEPENDENT_AMBULATORY_CARE_PROVIDER_SITE_OTHER): Payer: No Typology Code available for payment source | Admitting: Family Medicine

## 2016-03-31 VITALS — BP 126/82 | HR 98 | Temp 98.1°F | Resp 18 | Ht 62.0 in | Wt 304.2 lb

## 2016-03-31 DIAGNOSIS — E1165 Type 2 diabetes mellitus with hyperglycemia: Secondary | ICD-10-CM | POA: Diagnosis not present

## 2016-03-31 DIAGNOSIS — Z79899 Other long term (current) drug therapy: Secondary | ICD-10-CM | POA: Diagnosis not present

## 2016-03-31 DIAGNOSIS — Z794 Long term (current) use of insulin: Secondary | ICD-10-CM

## 2016-03-31 DIAGNOSIS — E559 Vitamin D deficiency, unspecified: Secondary | ICD-10-CM

## 2016-03-31 DIAGNOSIS — E6609 Other obesity due to excess calories: Secondary | ICD-10-CM | POA: Diagnosis not present

## 2016-03-31 DIAGNOSIS — E282 Polycystic ovarian syndrome: Secondary | ICD-10-CM

## 2016-03-31 DIAGNOSIS — E349 Endocrine disorder, unspecified: Secondary | ICD-10-CM | POA: Diagnosis not present

## 2016-03-31 DIAGNOSIS — E7800 Pure hypercholesterolemia, unspecified: Secondary | ICD-10-CM

## 2016-03-31 DIAGNOSIS — Z6841 Body Mass Index (BMI) 40.0 and over, adult: Secondary | ICD-10-CM

## 2016-03-31 DIAGNOSIS — IMO0002 Reserved for concepts with insufficient information to code with codable children: Secondary | ICD-10-CM

## 2016-03-31 DIAGNOSIS — F640 Transsexualism: Secondary | ICD-10-CM

## 2016-03-31 DIAGNOSIS — F413 Other mixed anxiety disorders: Secondary | ICD-10-CM

## 2016-03-31 DIAGNOSIS — Z789 Other specified health status: Secondary | ICD-10-CM

## 2016-03-31 DIAGNOSIS — F3341 Major depressive disorder, recurrent, in partial remission: Secondary | ICD-10-CM | POA: Diagnosis not present

## 2016-03-31 DIAGNOSIS — I10 Essential (primary) hypertension: Secondary | ICD-10-CM

## 2016-03-31 DIAGNOSIS — IMO0001 Reserved for inherently not codable concepts without codable children: Secondary | ICD-10-CM

## 2016-03-31 NOTE — Patient Instructions (Signed)
     IF you received an x-ray today, you will receive an invoice from [facility]. Please contact [facility] at [phone] with questions or concerns regarding your invoice.   IF you received labwork today, you will receive an invoice from [facility]. Please contact [facility] at [phone] with questions or concerns regarding your invoice.   Our billing staff will not be able to assist you with questions regarding bills from these companies.  You will be contacted with the lab results as soon as they are available. The fastest way to get your results is to activate your My Chart account. Instructions are located on the last page of this paperwork. If you have not heard from us regarding the results in 2 weeks, please contact this office.     
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
## 2016-04-03 ENCOUNTER — Ambulatory Visit (INDEPENDENT_AMBULATORY_CARE_PROVIDER_SITE_OTHER): Payer: No Typology Code available for payment source | Admitting: Family Medicine

## 2016-04-03 ENCOUNTER — Ambulatory Visit: Payer: No Typology Code available for payment source

## 2016-04-03 ENCOUNTER — Encounter: Payer: Self-pay | Admitting: Family Medicine

## 2016-04-03 VITALS — BP 124/82 | HR 90 | Temp 98.6°F | Resp 16 | Ht 62.0 in | Wt 304.0 lb

## 2016-04-03 DIAGNOSIS — Z794 Long term (current) use of insulin: Secondary | ICD-10-CM

## 2016-04-03 DIAGNOSIS — E349 Endocrine disorder, unspecified: Secondary | ICD-10-CM | POA: Diagnosis not present

## 2016-04-03 DIAGNOSIS — Z79899 Other long term (current) drug therapy: Secondary | ICD-10-CM

## 2016-04-03 DIAGNOSIS — I10 Essential (primary) hypertension: Secondary | ICD-10-CM

## 2016-04-03 DIAGNOSIS — E1165 Type 2 diabetes mellitus with hyperglycemia: Secondary | ICD-10-CM

## 2016-04-03 DIAGNOSIS — IMO0002 Reserved for concepts with insufficient information to code with codable children: Secondary | ICD-10-CM

## 2016-04-03 NOTE — Patient Instructions (Signed)
     IF you received an x-ray today, you will receive an invoice from [facility]. Please contact [facility] at [phone] with questions or concerns regarding your invoice.   IF you received labwork today, you will receive an invoice from [facility]. Please contact [facility] at [phone] with questions or concerns regarding your invoice.   Our billing staff will not be able to assist you with questions regarding bills from these companies.  You will be contacted with the lab results as soon as they are available. The fastest way to get your results is to activate your My Chart account. Instructions are located on the last page of this paperwork. If you have not heard from us regarding the results in 2 weeks, please contact this office.     

## 2016-04-04 LAB — LIPID PANEL
CHOLESTEROL TOTAL: 231 mg/dL — AB (ref 100–199)
Chol/HDL Ratio: 7.7 ratio units — ABNORMAL HIGH (ref 0.0–4.4)
HDL: 30 mg/dL — ABNORMAL LOW (ref >39)
LDL Calculated: 155 mg/dL — ABNORMAL HIGH (ref 0–99)
Triglycerides: 231 mg/dL — ABNORMAL HIGH (ref 0–149)
VLDL Cholesterol Cal: 46 mg/dL — ABNORMAL HIGH (ref 5–40)

## 2016-04-04 LAB — CBC
Hematocrit: 42 % (ref 34.0–46.6)
Hemoglobin: 14 g/dL (ref 11.1–15.9)
MCH: 31 pg (ref 26.6–33.0)
MCHC: 33.3 g/dL (ref 31.5–35.7)
MCV: 93 fL (ref 79–97)
PLATELETS: 294 10*3/uL (ref 150–379)
RBC: 4.52 x10E6/uL (ref 3.77–5.28)
RDW: 13.2 % (ref 12.3–15.4)
WBC: 12 10*3/uL — AB (ref 3.4–10.8)

## 2016-04-04 LAB — COMPREHENSIVE METABOLIC PANEL
ALBUMIN: 4.3 g/dL (ref 3.5–5.5)
ALK PHOS: 74 IU/L (ref 39–117)
ALT: 30 IU/L (ref 0–32)
AST: 19 IU/L (ref 0–40)
Albumin/Globulin Ratio: 1.5 (ref 1.2–2.2)
BUN/Creatinine Ratio: 20 (ref 9–23)
BUN: 12 mg/dL (ref 6–20)
Bilirubin Total: <0.2 mg/dL (ref 0.0–1.2)
CALCIUM: 9.7 mg/dL (ref 8.7–10.2)
CHLORIDE: 96 mmol/L (ref 96–106)
CO2: 26 mmol/L (ref 18–29)
CREATININE: 0.6 mg/dL (ref 0.57–1.00)
GFR calc Af Amer: 144 mL/min/{1.73_m2} (ref >59)
GFR, EST NON AFRICAN AMERICAN: 125 mL/min/{1.73_m2} (ref >59)
GLOBULIN, TOTAL: 2.8 g/dL (ref 1.5–4.5)
Glucose: 179 mg/dL — ABNORMAL HIGH (ref 65–99)
POTASSIUM: 4.3 mmol/L (ref 3.5–5.2)
SODIUM: 140 mmol/L (ref 134–144)
Total Protein: 7.1 g/dL (ref 6.0–8.5)

## 2016-04-04 LAB — ESTRADIOL: Estradiol: 25.5 pg/mL

## 2016-04-04 LAB — HEMOGLOBIN A1C
ESTIMATED AVERAGE GLUCOSE: 232 mg/dL
HEMOGLOBIN A1C: 9.7 % — AB (ref 4.8–5.6)

## 2016-04-04 LAB — TSH: TSH: 2.38 u[IU]/mL (ref 0.450–4.500)

## 2016-04-04 LAB — TESTOSTERONE, FREE, TOTAL, SHBG
Sex Hormone Binding: 7.7 nmol/L — ABNORMAL LOW (ref 24.6–122.0)
TESTOSTERONE FREE: 3.3 pg/mL (ref 0.0–4.2)
TESTOSTERONE: 25 ng/dL (ref 8–48)

## 2016-04-05 ENCOUNTER — Telehealth: Payer: Self-pay | Admitting: Family Medicine

## 2016-04-05 NOTE — Telephone Encounter (Signed)
Pt calling about lab results states that as soon as labs came back she's suppose to be on testosterone medicine please respond to labs

## 2016-04-06 ENCOUNTER — Encounter: Payer: Self-pay | Admitting: Family Medicine

## 2016-04-06 DIAGNOSIS — Z79899 Other long term (current) drug therapy: Secondary | ICD-10-CM | POA: Insufficient documentation

## 2016-04-06 DIAGNOSIS — E349 Endocrine disorder, unspecified: Secondary | ICD-10-CM | POA: Insufficient documentation

## 2016-04-06 DIAGNOSIS — E1165 Type 2 diabetes mellitus with hyperglycemia: Secondary | ICD-10-CM | POA: Insufficient documentation

## 2016-04-06 DIAGNOSIS — F329 Major depressive disorder, single episode, unspecified: Secondary | ICD-10-CM | POA: Insufficient documentation

## 2016-04-06 DIAGNOSIS — E7800 Pure hypercholesterolemia, unspecified: Secondary | ICD-10-CM | POA: Insufficient documentation

## 2016-04-06 DIAGNOSIS — IMO0002 Reserved for concepts with insufficient information to code with codable children: Secondary | ICD-10-CM | POA: Insufficient documentation

## 2016-04-06 DIAGNOSIS — E6609 Other obesity due to excess calories: Secondary | ICD-10-CM | POA: Insufficient documentation

## 2016-04-06 DIAGNOSIS — F413 Other mixed anxiety disorders: Secondary | ICD-10-CM | POA: Insufficient documentation

## 2016-04-06 DIAGNOSIS — E559 Vitamin D deficiency, unspecified: Secondary | ICD-10-CM | POA: Insufficient documentation

## 2016-04-06 DIAGNOSIS — F640 Transsexualism: Secondary | ICD-10-CM | POA: Insufficient documentation

## 2016-04-06 DIAGNOSIS — Z789 Other specified health status: Secondary | ICD-10-CM | POA: Insufficient documentation

## 2016-04-06 DIAGNOSIS — E282 Polycystic ovarian syndrome: Secondary | ICD-10-CM | POA: Insufficient documentation

## 2016-04-06 DIAGNOSIS — F32A Depression, unspecified: Secondary | ICD-10-CM | POA: Insufficient documentation

## 2016-04-06 DIAGNOSIS — Z794 Long term (current) use of insulin: Secondary | ICD-10-CM

## 2016-04-06 DIAGNOSIS — I10 Essential (primary) hypertension: Secondary | ICD-10-CM | POA: Insufficient documentation

## 2016-04-06 MED ORDER — TESTOSTERONE CYPIONATE 200 MG/ML IM SOLN: 50.0000 mg | INTRAMUSCULAR | 3 refills | Status: DC

## 2016-04-06 MED ORDER — INSULIN GLARGINE 100 UNIT/ML ~~LOC~~ SOLN: 70.0000 [IU] | Freq: Two times a day (BID) | SUBCUTANEOUS | 3 refills | Status: DC

## 2016-04-06 NOTE — Progress Notes (Signed)
Subjective:    Patient ID: Krista Williams, female    DOB: 12-18-1990, 26 y.o.   MRN: [id number] Chief Complaint  Patient presents with  . Follow-up    Start Testosterone    HPI  Krista Williams is doing well and is very excited and anxious to get started on testosterone as soon as possible. He is here today with his wife Krista Williams to review lab results and hopefully get started on testosterone therapy to help him on his female to female transition.  Unfortunately the labs have not yet been resulted  Past Medical History:  Diagnosis Date  . Autism   . Diabetes mellitus without complication ([place])   . High cholesterol   . Hypertension    No past surgical history on file. Current Outpatient Prescriptions on File Prior to Visit  Medication Sig Dispense Refill  . busPIRone (BUSPAR) 10 MG tablet Take 10 mg by mouth 2 (two) times daily.    . Cholecalciferol (VITAMIN D3) 2000 units TABS Take 2,000 mg by mouth daily.    . citalopram (CELEXA) 40 MG tablet Take 40 mg by mouth daily.    . hydrochlorothiazide (HYDRODIURIL) 25 MG tablet Take 1 tablet (25 mg total) by mouth daily. 90 tablet 3  . ibuprofen (ADVIL,MOTRIN) 200 MG tablet Take 1,600 mg by mouth every 8 (eight) hours as needed for moderate pain.    . Lactobacillus (ACIDOPHILUS/BIFIDUS PO) Take by mouth.    . lurasidone (LATUDA) 40 MG TABS tablet Take 40 mg by mouth daily with breakfast.    . Omega-3 Fatty Acids (FISH OIL) 1000 MG CAPS Take 1,000 mg by mouth.    . pantoprazole (PROTONIX) 20 MG tablet Take 20 mg by mouth.    . prazosin (MINIPRESS) 2 MG capsule Take 2 mg by mouth at bedtime.    . insulin glargine (LANTUS) 100 UNIT/ML injection Inject 0.7 mLs (70 Units total) into the skin 2 (two) times daily. 50 mL 3  . testosterone cypionate (DEPO-TESTOSTERONE) 200 MG/ML injection Inject 0.25 mLs (50 mg total) into the muscle every 7 (seven) days. 1 mL 3   No current facility-administered medications on file prior to visit.    Allergies    Allergen Reactions  . Metformin And Related   . Victoza [Liraglutide]    Family History  Problem Relation Age of Onset  . Hypertension Father   . Diabetes Father   . Diabetes Sister   . Diabetes Maternal Grandmother   . Cancer Other   . Diabetes Other    Social History   Social History  . Marital status: Married    Spouse name: N/A  . Number of children: N/A  . Years of education: N/A   Social History Main Topics  . Smoking status: Never Smoker  . Smokeless tobacco: Never Used  . Alcohol use No  . Drug use: Unknown  . Sexual activity: No   Other Topics Concern  . None   Social History Narrative  . None   Depression screen [facility] 2/9 04/03/2016 03/31/2016 12/10/2012  Decreased Interest 0 0 1  Down, Depressed, Hopeless 0 0 1  PHQ - 2 Score 0 0 2  Feeling bad or failure about yourself  - - 1  Moving slowly or fidgety/restless - - 0  Suicidal thoughts - - 0     Review of Systems  Constitutional: Negative for activity change, appetite change, chills, diaphoresis and fever.  Respiratory: Negative for chest tightness.   Cardiovascular: Negative for chest pain and palpitations.  Psychiatric/Behavioral: Negative for behavioral problems, confusion and dysphoric mood. The patient is not nervous/anxious.        Objective:   Physical Exam  Constitutional: She is oriented to person, place, and time. She appears well-developed and well-nourished. No distress.  HENT:  Head: Normocephalic and atraumatic.  Right Ear: External ear normal.  Eyes: Conjunctivae are normal. No scleral icterus.  Pulmonary/Chest: Effort normal.  Neurological: She is alert and oriented to person, place, and time.  Skin: Skin is warm and dry. She is not diaphoretic. No erythema.  Psychiatric: She has a normal mood and affect. Her behavior is normal.      BP 124/82   Pulse 90   Temp 98.6 F (37 C) (Oral)   Resp 16   Ht 5\' 2"  (1.575 m)   Wt (!) 304 lb (137.9 kg)   SpO2 95%   BMI 55.60 kg/m       Assessment & Plan:   1. Endocrine disorder   2. Essential hypertension   3. Encounter for long-term current use of high risk medication   4. Insulin dependent type 2 diabetes mellitus, uncontrolled ([place])   Will call patient when labs return to discuss options and likely start Depo-testosterone which Krista Williams feels very comfortable administering to himself at home as he has been using insulin for so long.  Primary Care at [facility] [address] [place], [place] [address] [phone] phone [phone] fax  04/06/16 7:03 PM

## 2016-04-06 NOTE — Telephone Encounter (Signed)
Called pt and discussed labs.

## 2016-04-06 NOTE — Telephone Encounter (Signed)
Labs not yet reviewed? Forward to Dr. Brigitte Pulse.  Patient notified via My Chart.

## 2016-04-09 ENCOUNTER — Emergency Department (HOSPITAL_COMMUNITY)
Admission: EM | Admit: 2016-04-09 | Discharge: 2016-04-10 | Disposition: A | Discharge status: Home / Self Care | Payer: No Typology Code available for payment source | Attending: Emergency Medicine | Admitting: Emergency Medicine

## 2016-04-09 ENCOUNTER — Encounter (HOSPITAL_COMMUNITY): Payer: Self-pay | Admitting: Emergency Medicine

## 2016-04-09 ENCOUNTER — Emergency Department (HOSPITAL_COMMUNITY): Payer: No Typology Code available for payment source

## 2016-04-09 DIAGNOSIS — E119 Type 2 diabetes mellitus without complications: Secondary | ICD-10-CM | POA: Diagnosis not present

## 2016-04-09 DIAGNOSIS — Z794 Long term (current) use of insulin: Secondary | ICD-10-CM | POA: Diagnosis not present

## 2016-04-09 DIAGNOSIS — Z79899 Other long term (current) drug therapy: Secondary | ICD-10-CM | POA: Diagnosis not present

## 2016-04-09 DIAGNOSIS — M25532 Pain in left wrist: Secondary | ICD-10-CM | POA: Insufficient documentation

## 2016-04-09 DIAGNOSIS — I10 Essential (primary) hypertension: Secondary | ICD-10-CM | POA: Insufficient documentation

## 2016-04-09 MED ORDER — HYDROCODONE-ACETAMINOPHEN 5-325 MG PO TABS
2.0000 | ORAL_TABLET | Freq: Once | ORAL | Status: AC
  Administered 2016-04-09: 2 via ORAL
  Filled 2016-04-09: qty 2

## 2016-04-09 NOTE — ED Triage Notes (Signed)
Patient c/o left wrist pain x4 days. Patient reports dx wrist fracture but states "I don't think the splint is good." Patient reports increasing pain since splint placement.

## 2016-04-10 MED ORDER — HYDROCODONE-ACETAMINOPHEN 5-325 MG PO TABS: 1.0000 | ORAL_TABLET | Freq: Four times a day (QID) | ORAL | 0 refills | Status: DC | PRN

## 2016-04-10 NOTE — ED Provider Notes (Signed)
[facility] DEPT Provider Note   CSN: [id number] Arrival date & time: 04/09/16  2243     History   Chief Complaint Chief Complaint  Patient presents with  . Wrist Pain    HPI Krista Williams is a 26 y.o. female who identifies as female who presents with a four-day history of left wrist pain after falling on outstretched hand. [facility] in [facility] splint. Patient is unsure which bone was broken, however he believes it began with a C. He continues to have worsening pain and swelling to his hand and wrist, worse in the morning. The splint allows him to extend his wrist. Patient has been taking Aleve without relief. Patient notes pain and paresthesias to his fifth and fourth digits, but no complete numbness. Patient was not given any follow-up to orthopedics. He was advised to follow up with PCP. Patient has had associated nausea due to pain. He denies any other symptoms, however.  HPI  Past Medical History:  Diagnosis Date  . Autism   . Diabetes mellitus without complication ([place])   . High cholesterol   . Hypertension     Patient Active Problem List   Diagnosis Date Noted  . Insulin dependent type 2 diabetes mellitus, uncontrolled ([place]) 04/06/2016  . High cholesterol 04/06/2016  . Hypertension 04/06/2016  . Endocrine disorder 04/06/2016  . Encounter for long-term current use of high risk medication 04/06/2016  . Obesity due to excess calories with serious comorbidity 04/06/2016  . Other mixed anxiety disorders 04/06/2016  . Depression 04/06/2016  . Female-to-female transgender person 04/06/2016  . PCOS (polycystic ovarian syndrome) 04/06/2016  . Vitamin D deficiency 04/06/2016    History reviewed. No pertinent surgical history.  OB History    No data available       Home Medications    Prior to Admission medications   Medication Sig Start Date End Date Taking? Authorizing Provider  busPIRone (BUSPAR) 10 MG tablet Take 10 mg by mouth  2 (two) times daily.    Historical Provider, MD  Cholecalciferol (VITAMIN D3) 2000 units TABS Take 2,000 mg by mouth daily.    Historical Provider, MD  citalopram (CELEXA) 40 MG tablet Take 40 mg by mouth daily.    Historical Provider, MD  hydrochlorothiazide (HYDRODIURIL) 25 MG tablet Take 1 tablet (25 mg total) by mouth daily. 12/10/12   Reyne Dumas, MD  HYDROcodone-acetaminophen (NORCO/VICODIN) 5-325 MG tablet Take 1-2 tablets by mouth every 6 (six) hours as needed for severe pain. 04/10/16   Frederica Kuster, PA-C  ibuprofen (ADVIL,MOTRIN) 200 MG tablet Take 1,600 mg by mouth every 8 (eight) hours as needed for moderate pain.    Historical Provider, MD  insulin glargine (LANTUS) 100 UNIT/ML injection Inject 0.7 mLs (70 Units total) into the skin 2 (two) times daily. 04/06/16   Shawnee Knapp, MD  Lactobacillus (ACIDOPHILUS/BIFIDUS PO) Take by mouth.    Historical Provider, MD  lurasidone (LATUDA) 40 MG TABS tablet Take 40 mg by mouth daily with breakfast.    Historical Provider, MD  Omega-3 Fatty Acids (FISH OIL) 1000 MG CAPS Take 1,000 mg by mouth.    Historical Provider, MD  pantoprazole (PROTONIX) 20 MG tablet Take 20 mg by mouth.    Historical Provider, MD  prazosin (MINIPRESS) 2 MG capsule Take 2 mg by mouth at bedtime.    Historical Provider, MD  testosterone cypionate (DEPO-TESTOSTERONE) 200 MG/ML injection Inject 0.25 mLs (50 mg total) into the muscle every 7 (  seven) days. 04/06/16   Shawnee Knapp, MD    Family History Family History  Problem Relation Age of Onset  . Hypertension Father   . Diabetes Father   . Diabetes Sister   . Diabetes Maternal Grandmother   . Cancer Other   . Diabetes Other     Social History Social History  Substance Use Topics  . Smoking status: Never Smoker  . Smokeless tobacco: Never Used  . Alcohol use No     Allergies   Metformin and related and Victoza [liraglutide]   Review of Systems Review of Systems  Constitutional: Negative for fever.    Gastrointestinal: Positive for nausea.  Musculoskeletal: Positive for arthralgias and joint swelling.  Skin: Negative for rash and wound.     Physical Exam Updated Vital Signs BP (!) 175/98 (BP Location: Right Wrist)   Pulse 98   Temp 97.8 F (36.6 C) (Oral)   Ht 5\' 2"  (1.575 m)   Wt 135.2 kg   SpO2 98%   BMI 54.50 kg/m   Physical Exam  Constitutional: She appears well-developed and well-nourished. No distress.  HENT:  Head: Normocephalic and atraumatic.  Mouth/Throat: Oropharynx is clear and moist. No oropharyngeal exudate.  Eyes: Conjunctivae are normal. Pupils are equal, round, and reactive to light. Right eye exhibits no discharge. Left eye exhibits no discharge. No scleral icterus.  Neck: Normal range of motion. Neck supple. No thyromegaly present.  Cardiovascular: Normal rate, regular rhythm, normal heart sounds and intact distal pulses.  Exam reveals no gallop and no friction rub.   No murmur heard. Pulmonary/Chest: Effort normal and breath sounds normal. No stridor. No respiratory distress. She has no wheezes. She has no rales.  Abdominal: Soft. Bowel sounds are normal. She exhibits no distension. There is no tenderness. There is no rebound and no guarding.  Musculoskeletal: She exhibits no edema.       Left wrist: She exhibits decreased range of motion, tenderness, bony tenderness and swelling.       Left hand: She exhibits tenderness and bony tenderness. She exhibits normal range of motion and normal capillary refill. Decreased sensation (Paresthesia to 4th and 5th digit) noted. Normal strength noted.       Hands: L wrist and hand: Ulnar and radial wrist tenderness, tenderness to dorsum with mild anatomical snuffbox tenderness, paresthesias and pain to palpation of fourth and fifth digits, normal sensation except as stated above, radial pulse intact, cap refill <2secs, mild edema  Lymphadenopathy:    She has no cervical adenopathy.  Neurological: She is alert.  Coordination normal.  Skin: Skin is warm and dry. No rash noted. She is not diaphoretic. No pallor.  Psychiatric: She has a normal mood and affect.  Nursing note and vitals reviewed.    ED Treatments / Results  Labs (all labs ordered are listed, but only abnormal results are displayed) Labs Reviewed - No data to display  EKG  EKG Interpretation None       Radiology Dg Wrist Complete Left  Result Date: 04/09/2016 CLINICAL DATA:  Status post fall 4 days ago, with left wrist pain. Initial encounter. EXAM: LEFT WRIST - COMPLETE 3+ VIEW COMPARISON:  None. FINDINGS: There is question of focal lucency along the waist of the scaphoid. This would be better assessed on repeat navicular view without the splint, if clinically appropriate. The carpal rows are intact, and demonstrate normal alignment. The joint spaces are preserved. Mild negative ulnar variance is noted. No significant soft tissue abnormalities are seen.  IMPRESSION: Question of focal lucency along the waist of the scaphoid. This would be better assessed on repeat navicular view without the splint, if clinically appropriate. Would correlate for clinical evidence of scaphoid fracture. Electronically Signed   By: Garald Balding M.D.   On: 04/09/2016 23:59   Dg Hand Complete Left  Result Date: 04/10/2016 CLINICAL DATA:  Status post fall 4 days ago, with worsening left hand pain. Initial encounter. EXAM: LEFT HAND - COMPLETE 3+ VIEW COMPARISON:  None. FINDINGS: There is no evidence of fracture or dislocation. The joint spaces are preserved. The questionable lucency at the waist of the scaphoid is not well seen on hand radiographs. Negative ulnar variance is noted. The soft tissues are unremarkable in appearance. IMPRESSION: No evidence of fracture or dislocation. Questionable lucency at the waist of the scaphoid is not well seen on hand radiographs, though as described on the wrist radiographs, a follow-up navicular view without the splint  would be helpful if clinically appropriate. Electronically Signed   By: Garald Balding M.D.   On: 04/10/2016 00:00    Procedures Procedures (including critical care time)  SPLINT APPLICATION Date/Time: 1:06 AM Authorized by: Frederica Kuster Consent: Verbal consent obtained. Risks and benefits: risks, benefits and alternatives were discussed Consent given by: patient Splint applied by: orthopedic technician Location details: L wrist Splint type: thumb spica with wrist immobilization Supplies used: fiberglass, ACE Post-procedure: The splinted body part was neurovascularly unchanged following the procedure. Patient's paresthesias have resolved and pain is much improved.  Patient tolerance: Patient tolerated the procedure well with no immediate complications.     Medications Ordered in ED Medications  HYDROcodone-acetaminophen (NORCO/VICODIN) 5-325 MG per tablet 2 tablet (2 tablets Oral Given 04/09/16 2337)     Initial Impression / Assessment and Plan / ED Course  I have reviewed the triage vital signs and the nursing notes.  Pertinent labs & imaging results that were available during my care of the patient were reviewed by me and considered in my medical decision making (see chart for details).     X-rays of the wrist and hand show questionable focal lucency along the waist of the scaphoid. I have discussed patient case with hand surgeon, Dr. Grandville Silos, who will have his office call patient tomorrow to set up an appointment for follow-up this week. Will change splint and placed in thumb spica with sugar tong. Will discharge home with short course of Norco. I reviewed [place] narcotic database and found no discrepancies. Return precautions discussed. Patient understands and agrees with plan. Patient vitals stable throughout ED course discharged in satisfactory condition. I discussed patient case with Dr. Zenia Resides who guided the patient's management and aggressive plan.  Final Clinical  Impressions(s) / ED Diagnoses   Final diagnoses:  Acute pain of left wrist    New Prescriptions New Prescriptions   HYDROCODONE-ACETAMINOPHEN (NORCO/VICODIN) 5-325 MG TABLET    Take 1-2 tablets by mouth every 6 (six) hours as needed for severe pain.     Frederica Kuster, PA-C 04/10/16 0120    Lacretia Leigh, MD 04/10/16 [phone]

## 2016-04-10 NOTE — ED Notes (Signed)
Pt is aware he is waiting on ortho tech from [facility]. Also, provided orange juice for patient with permission from provider.

## 2016-04-10 NOTE — Progress Notes (Signed)
Orthopedic Tech Progress Note Patient Details:  Krista Williams 08/08/1990 [id number]  Ortho Devices Type of Ortho Device: Arm sling, Sugartong splint, Thumb spica splint Splint Material: Fiberglass Ortho Device/Splint Location: lue Ortho Device/Splint Interventions: Ordered, Application   Karolee Stamps 04/10/2016, 1:16 AM

## 2016-04-10 NOTE — ED Notes (Signed)
Ortho tech at bedside 

## 2016-04-10 NOTE — Discharge Instructions (Signed)
Medications: Norco  Treatment: Take 1-2 Norco every 4-6 hours as needed for severe pain. You can alternate with ibuprofen or Aleve as prescribed over-the-counter. Keep splint clean and dry. Wear sling when he sleep to help protect your arm.  Follow-up: Please follow-up with Dr. Grandville Silos, a hand surgeon, for further evaluation and treatment. His office will call you tomorrow to set up an appointment. Please return to emergency department if you develop any new or worsening symptoms.

## 2016-04-26 ENCOUNTER — Ambulatory Visit (HOSPITAL_COMMUNITY): Payer: Self-pay | Admitting: Psychiatry

## 2016-04-28 ENCOUNTER — Telehealth: Payer: Self-pay | Admitting: Family Medicine

## 2016-04-28 NOTE — Telephone Encounter (Signed)
Pt wife Krista Williams called stated that pt pt needs an early refill on testosterone cypionate (DEPO-TESTOSTERONE) 200 MG/ML injection [[id number]]  Please advise: [phone]

## 2016-04-28 NOTE — Telephone Encounter (Signed)
Why,?how much has he been taking?  Recommend come in for recheck with repeat labs asap if he has increase dose on own.

## 2016-04-28 NOTE — Telephone Encounter (Signed)
Pt claims that she only got 3 weeks dose not 4 weeks   Pharmacist showed them and marked syringes so they know they got the right dose.  I okd early refill per dr Brigitte Pulse with [facility] per pt request.

## 2016-04-28 NOTE — Telephone Encounter (Signed)
04/06/16 last rx, ok to refill early? (has refills at pharmacy) due 05/03/16

## 2016-07-06 DIAGNOSIS — Z794 Long term (current) use of insulin: Secondary | ICD-10-CM | POA: Diagnosis not present

## 2016-07-06 DIAGNOSIS — Z79899 Other long term (current) drug therapy: Secondary | ICD-10-CM | POA: Diagnosis not present

## 2016-07-06 DIAGNOSIS — E1165 Type 2 diabetes mellitus with hyperglycemia: Secondary | ICD-10-CM | POA: Diagnosis not present

## 2016-07-06 DIAGNOSIS — R358 Other polyuria: Secondary | ICD-10-CM | POA: Diagnosis present

## 2016-07-06 DIAGNOSIS — I10 Essential (primary) hypertension: Secondary | ICD-10-CM | POA: Diagnosis not present

## 2016-07-06 LAB — CBG MONITORING, ED: Glucose-Capillary: 402 mg/dL — ABNORMAL HIGH (ref 65–99)

## 2016-07-06 MED ORDER — SODIUM CHLORIDE 0.9 % IV BOLUS (SEPSIS)
2000.0000 mL | Freq: Once | INTRAVENOUS | Status: AC
  Administered 2016-07-07: 2000 mL via INTRAVENOUS

## 2016-07-07 ENCOUNTER — Emergency Department (HOSPITAL_COMMUNITY)
Admission: EM | Admit: 2016-07-07 | Discharge: 2016-07-07 | Disposition: A | Discharge status: Home / Self Care | Payer: Medicaid Other | Attending: Emergency Medicine | Admitting: Emergency Medicine

## 2016-07-07 ENCOUNTER — Encounter (HOSPITAL_COMMUNITY): Payer: Self-pay | Admitting: Nurse Practitioner

## 2016-07-07 DIAGNOSIS — R739 Hyperglycemia, unspecified: Secondary | ICD-10-CM

## 2016-07-07 LAB — BLOOD GAS, VENOUS
ACID-BASE EXCESS: 3.8 mmol/L — AB (ref 0.0–2.0)
BICARBONATE: 29.5 mmol/L — AB (ref 20.0–28.0)
FIO2: 21
O2 SAT: 72.9 %
PATIENT TEMPERATURE: 98.1
pCO2, Ven: 49.9 mmHg (ref 44.0–60.0)
pH, Ven: 7.388 (ref 7.250–7.430)
pO2, Ven: 40.9 mmHg (ref 32.0–45.0)

## 2016-07-07 LAB — CBC WITH DIFFERENTIAL/PLATELET
BASOS ABS: 0 10*3/uL (ref 0.0–0.1)
BASOS PCT: 0 %
EOS PCT: 2 %
Eosinophils Absolute: 0.2 10*3/uL (ref 0.0–0.7)
HCT: 44.3 % (ref 36.0–46.0)
Hemoglobin: 15.1 g/dL — ABNORMAL HIGH (ref 12.0–15.0)
LYMPHS PCT: 19 %
Lymphs Abs: 2.5 10*3/uL (ref 0.7–4.0)
MCH: 29.8 pg (ref 26.0–34.0)
MCHC: 34.1 g/dL (ref 30.0–36.0)
MCV: 87.5 fL (ref 78.0–100.0)
MONO ABS: 0.6 10*3/uL (ref 0.1–1.0)
Monocytes Relative: 5 %
Neutro Abs: 9.9 10*3/uL — ABNORMAL HIGH (ref 1.7–7.7)
Neutrophils Relative %: 74 %
PLATELETS: 289 10*3/uL (ref 150–400)
RBC: 5.06 MIL/uL (ref 3.87–5.11)
RDW: 13.1 % (ref 11.5–15.5)
WBC: 13.2 10*3/uL — ABNORMAL HIGH (ref 4.0–10.5)

## 2016-07-07 LAB — URINALYSIS, MICROSCOPIC (REFLEX): RBC / HPF: NONE SEEN RBC/hpf (ref 0–5)

## 2016-07-07 LAB — BASIC METABOLIC PANEL
ANION GAP: 10 (ref 5–15)
BUN: 9 mg/dL (ref 6–20)
CALCIUM: 9.4 mg/dL (ref 8.9–10.3)
CO2: 28 mmol/L (ref 22–32)
CREATININE: 0.61 mg/dL (ref 0.44–1.00)
Chloride: 99 mmol/L — ABNORMAL LOW (ref 101–111)
GFR calc Af Amer: >60 mL/min (ref >60)
GLUCOSE: 410 mg/dL — AB (ref 65–99)
Potassium: 4 mmol/L (ref 3.5–5.1)
Sodium: 137 mmol/L (ref 135–145)

## 2016-07-07 LAB — URINALYSIS, ROUTINE W REFLEX MICROSCOPIC
BILIRUBIN URINE: NEGATIVE
Nitrite: NEGATIVE
PH: 6 (ref 5.0–8.0)
PROTEIN: NEGATIVE mg/dL
Specific Gravity, Urine: 1.02 (ref 1.005–1.030)

## 2016-07-07 LAB — CBG MONITORING, ED: Glucose-Capillary: 321 mg/dL — ABNORMAL HIGH (ref 65–99)

## 2016-07-07 MED ORDER — INSULIN GLARGINE 100 UNIT/ML ~~LOC~~ SOLN: 65.0000 [IU] | Freq: Two times a day (BID) | SUBCUTANEOUS | 1 refills | Status: DC

## 2016-07-07 MED ORDER — INSULIN GLARGINE 100 UNIT/ML ~~LOC~~ SOLN
65.0000 [IU] | Freq: Once | SUBCUTANEOUS | Status: AC
  Administered 2016-07-07: 65 [IU] via SUBCUTANEOUS
  Filled 2016-07-07: qty 0.65

## 2016-07-07 NOTE — ED Notes (Signed)
Patient stuck twice for blood draw. Unsuccessful on first attempt. Second attempt, blood flashed and pt jerked and vein blew. RN aware.

## 2016-07-07 NOTE — ED Provider Notes (Signed)
[facility] DEPT Provider Note   CSN: [id number] Arrival date & time: 07/06/16  2340     History   Chief Complaint Chief Complaint  Patient presents with  . Hyperglycemia    HPI Krista Williams is a 26 y.o. female.  Patient with a history of DM, on Lantus 65 U in pm and 65 U in am, presents after being out of his medication for the last 4 days. He denies nausea, vomiting or pain. He reports he started having polydipsia and polyuria and knew he needed to seek treatment.    The history is provided by the patient. No language interpreter was used.  Hyperglycemia  Associated symptoms: increased thirst and polyuria   Associated symptoms: no abdominal pain, no chest pain, no fever, no shortness of breath and no vomiting     Past Medical History:  Diagnosis Date  . Autism   . Diabetes mellitus without complication ([place])   . High cholesterol   . Hypertension     Patient Active Problem List   Diagnosis Date Noted  . Insulin dependent type 2 diabetes mellitus, uncontrolled ([place]) 04/06/2016  . High cholesterol 04/06/2016  . Hypertension 04/06/2016  . Endocrine disorder 04/06/2016  . Encounter for long-term current use of high risk medication 04/06/2016  . Obesity due to excess calories with serious comorbidity 04/06/2016  . Other mixed anxiety disorders 04/06/2016  . Depression 04/06/2016  . Female-to-female transgender person 04/06/2016  . PCOS (polycystic ovarian syndrome) 04/06/2016  . Vitamin D deficiency 04/06/2016    History reviewed. No pertinent surgical history.  OB History    No data available       Home Medications    Prior to Admission medications   Medication Sig Start Date End Date Taking? Authorizing Provider  busPIRone (BUSPAR) 10 MG tablet Take 10 mg by mouth 2 (two) times daily.   Yes [provider]  Cholecalciferol (VITAMIN D3) 2000 units TABS Take 2,000 mg by mouth daily after breakfast.    Yes [provider]  citalopram  (CELEXA) 40 MG tablet Take 40 mg by mouth every evening.    Yes [provider]  hydrochlorothiazide (HYDRODIURIL) 25 MG tablet Take 1 tablet (25 mg total) by mouth daily. Patient taking differently: Take 25 mg by mouth daily after breakfast.  12/10/12  Yes Reyne Dumas, MD  insulin glargine (LANTUS) 100 UNIT/ML injection Inject 0.7 mLs (70 Units total) into the skin 2 (two) times daily. Patient taking differently: Inject 65 Units into the skin 2 (two) times daily.  04/06/16  Yes Shawnee Knapp, MD  Lactobacillus (ACIDOPHILUS/BIFIDUS PO) Take 1 tablet by mouth 2 (two) times daily.    Yes [provider]  Omega-3 Fatty Acids (FISH OIL) 1000 MG CAPS Take 1,000 mg by mouth 2 (two) times daily.    Yes [provider]  pantoprazole (PROTONIX) 20 MG tablet Take 20 mg by mouth daily after breakfast.    Yes [provider]  prazosin (MINIPRESS) 2 MG capsule Take 2 mg by mouth at bedtime.   Yes [provider]  testosterone cypionate (DEPO-TESTOSTERONE) 200 MG/ML injection Inject 0.25 mLs (50 mg total) into the muscle every 7 (seven) days. 04/06/16  Yes Shawnee Knapp, MD  HYDROcodone-acetaminophen (NORCO/VICODIN) 5-325 MG tablet Take 1-2 tablets by mouth every 6 (six) hours as needed for severe pain. Patient not taking: Reported on 07/07/2016 04/10/16   Frederica Kuster, PA-C    Family History Family History  Problem Relation Age of Onset  .  Hypertension Father   . Diabetes Father   . Diabetes Sister   . Diabetes Maternal Grandmother   . Cancer Other   . Diabetes Other     Social History Social History  Substance Use Topics  . Smoking status: Never Smoker  . Smokeless tobacco: Never Used  . Alcohol use No     Allergies   Metformin and related and Victoza [liraglutide]   Review of Systems Review of Systems  Constitutional: Negative for chills and fever.  Respiratory: Negative.  Negative for shortness of breath.   Cardiovascular: Negative.  Negative  for chest pain.  Gastrointestinal: Negative.  Negative for abdominal pain and vomiting.  Endocrine: Positive for polydipsia and polyuria.  Genitourinary: Positive for frequency.  Musculoskeletal: Negative.  Negative for myalgias.  Skin: Negative.   Neurological: Negative.      Physical Exam Updated Vital Signs BP 132/76   Pulse 98   Temp 98.1 F (36.7 C) (Oral)   Resp 18   Ht 5\' 6"  (1.676 m)   Wt (!) 139.3 kg (307 lb)   SpO2 98%   BMI 49.55 kg/m   Physical Exam  Constitutional: She is oriented to person, place, and time. She appears well-developed and well-nourished.  HENT:  Head: Normocephalic.  Mouth/Throat: Oropharynx is clear and moist.  Neck: Normal range of motion. Neck supple.  Cardiovascular: Normal rate and regular rhythm.   Pulmonary/Chest: Effort normal and breath sounds normal. She has no wheezes. She has no rales.  Abdominal: Soft. Bowel sounds are normal. There is no tenderness. There is no rebound and no guarding.  Musculoskeletal: Normal range of motion.  Neurological: She is alert and oriented to person, place, and time.  Skin: Skin is warm and dry.  Psychiatric: She has a normal mood and affect.     ED Treatments / Results  Labs (all labs ordered are listed, but only abnormal results are displayed) Labs Reviewed  CBC WITH DIFFERENTIAL/PLATELET - Abnormal; Notable for the following:       Result Value   WBC 13.2 (*)    Hemoglobin 15.1 (*)    Neutro Abs 9.9 (*)    All other components within normal limits  BASIC METABOLIC PANEL - Abnormal; Notable for the following:    Chloride 99 (*)    Glucose, Bld 410 (*)    All other components within normal limits  BLOOD GAS, VENOUS - Abnormal; Notable for the following:    Bicarbonate 29.5 (*)    Acid-Base Excess 3.8 (*)    All other components within normal limits  URINALYSIS, ROUTINE W REFLEX MICROSCOPIC - Abnormal; Notable for the following:    APPearance HAZY (*)    Glucose, UA >=500 (*)    Hgb  urine dipstick SMALL (*)    Ketones, ur TRACE (*)    Leukocytes, UA TRACE (*)    All other components within normal limits  URINALYSIS, MICROSCOPIC (REFLEX) - Abnormal; Notable for the following:    Bacteria, UA RARE (*)    Squamous Epithelial / LPF 0-5 (*)    All other components within normal limits  CBG MONITORING, ED - Abnormal; Notable for the following:    Glucose-Capillary 402 (*)    All other components within normal limits  CBG MONITORING, ED - Abnormal; Notable for the following:    Glucose-Capillary 321 (*)    All other components within normal limits    EKG  EKG Interpretation None       Radiology No results found.  Procedures Procedures (including critical care time)  Medications Ordered in ED Medications  sodium chloride 0.9 % bolus 2,000 mL (0 mLs Intravenous Stopped 07/07/16 0245)  insulin glargine (LANTUS) injection 65 Units (65 Units Subcutaneous Given 07/07/16 0240)     Initial Impression / Assessment and Plan / ED Course  I have reviewed the triage vital signs and the nursing notes.  Pertinent labs & imaging results that were available during my care of the patient were reviewed by me and considered in my medical decision making (see chart for details).     The patient here for hyperglycemia after being out of his Lantus for the last 4 days. No vomiting.   No evidence of DKA. Lantus provided here. Blood sugar improving. VSS. She is felt appropriate for discharge home.   Final Clinical Impressions(s) / ED Diagnoses   Final diagnoses:  None   1. Hyperglycemia    New Prescriptions New Prescriptions   No medications on file     Charlann Lange, Hershal Coria 07/07/16 [place], [place], DO 07/07/16 2761

## 2016-07-07 NOTE — ED Triage Notes (Signed)
Pt states she has been out of her insulin lantus for the last 4 days. C/o nausea.

## 2016-07-16 ENCOUNTER — Emergency Department (HOSPITAL_COMMUNITY): Payer: Medicaid Other

## 2016-07-16 ENCOUNTER — Emergency Department (HOSPITAL_COMMUNITY)
Admission: EM | Admit: 2016-07-16 | Discharge: 2016-07-16 | Disposition: A | Discharge status: Home / Self Care | Payer: Medicaid Other | Attending: Emergency Medicine | Admitting: Emergency Medicine

## 2016-07-16 ENCOUNTER — Encounter (HOSPITAL_COMMUNITY): Payer: Self-pay | Admitting: Family Medicine

## 2016-07-16 DIAGNOSIS — I10 Essential (primary) hypertension: Secondary | ICD-10-CM | POA: Diagnosis not present

## 2016-07-16 DIAGNOSIS — E119 Type 2 diabetes mellitus without complications: Secondary | ICD-10-CM | POA: Diagnosis not present

## 2016-07-16 DIAGNOSIS — M79642 Pain in left hand: Secondary | ICD-10-CM | POA: Insufficient documentation

## 2016-07-16 DIAGNOSIS — F840 Autistic disorder: Secondary | ICD-10-CM | POA: Diagnosis not present

## 2016-07-16 DIAGNOSIS — Z794 Long term (current) use of insulin: Secondary | ICD-10-CM | POA: Diagnosis not present

## 2016-07-16 DIAGNOSIS — Z79899 Other long term (current) drug therapy: Secondary | ICD-10-CM | POA: Diagnosis not present

## 2016-07-16 DIAGNOSIS — M25532 Pain in left wrist: Secondary | ICD-10-CM | POA: Diagnosis not present

## 2016-07-16 LAB — CBG MONITORING, ED: Glucose-Capillary: 317 mg/dL — ABNORMAL HIGH (ref 65–99)

## 2016-07-16 MED ORDER — IBUPROFEN 600 MG PO TABS: 600.0000 mg | ORAL_TABLET | Freq: Four times a day (QID) | ORAL | 0 refills | Status: DC | PRN

## 2016-07-16 NOTE — ED Triage Notes (Signed)
Patient reports she is experiencing left hand pain with swelling that radiates to her left elbow. Pt denies any recent injury. Symptoms started yesterday.

## 2016-07-16 NOTE — ED Notes (Signed)
Called ortho to bedside  

## 2016-07-16 NOTE — ED Provider Notes (Signed)
[facility] DEPT Provider Note   CSN: [id number] Arrival date & time: 07/16/16  1926     History   Chief Complaint Chief Complaint  Patient presents with  . Arm Pain  . Arm Swelling    HPI Krista Williams is a 26 y.o. female.  Patient (FTM transgender) presents to the emergency department with chief complaint of left hand pain and wrist pain.  He states that he recently broke his wrist, and was wearing a cast, but removed it without seeing his doctor. He states that the pain worsened today and reports associated swelling. He denies any new injury. Denies any redness or fever. The pain is worsened with movement and palpation. There are no other associated symptoms.   The history is provided by the patient. No language interpreter was used.    Past Medical History:  Diagnosis Date  . Autism   . Diabetes mellitus without complication ([place])   . High cholesterol   . Hypertension     Patient Active Problem List   Diagnosis Date Noted  . Insulin dependent type 2 diabetes mellitus, uncontrolled ([place]) 04/06/2016  . High cholesterol 04/06/2016  . Hypertension 04/06/2016  . Endocrine disorder 04/06/2016  . Encounter for long-term current use of high risk medication 04/06/2016  . Obesity due to excess calories with serious comorbidity 04/06/2016  . Other mixed anxiety disorders 04/06/2016  . Depression 04/06/2016  . Female-to-female transgender person 04/06/2016  . PCOS (polycystic ovarian syndrome) 04/06/2016  . Vitamin D deficiency 04/06/2016    History reviewed. No pertinent surgical history.  OB History    No data available       Home Medications    Prior to Admission medications   Medication Sig Start Date End Date Taking? Authorizing Provider  busPIRone (BUSPAR) 10 MG tablet Take 10 mg by mouth 2 (two) times daily.    [provider]  Cholecalciferol (VITAMIN D3) 2000 units TABS Take 2,000 mg by mouth daily after breakfast.     [provider]  citalopram (CELEXA) 40 MG tablet Take 40 mg by mouth every evening.     [provider]  hydrochlorothiazide (HYDRODIURIL) 25 MG tablet Take 1 tablet (25 mg total) by mouth daily. Patient taking differently: Take 25 mg by mouth daily after breakfast.  12/10/12   Reyne Dumas, MD  HYDROcodone-acetaminophen (NORCO/VICODIN) 5-325 MG tablet Take 1-2 tablets by mouth every 6 (six) hours as needed for severe pain. Patient not taking: Reported on 07/07/2016 04/10/16   Frederica Kuster, PA-C  insulin glargine (LANTUS) 100 UNIT/ML injection Inject 0.65 mLs (65 Units total) into the skin 2 (two) times daily. 07/07/16   Charlann Lange, PA-C  Lactobacillus (ACIDOPHILUS/BIFIDUS PO) Take 1 tablet by mouth 2 (two) times daily.     [provider]  Omega-3 Fatty Acids (FISH OIL) 1000 MG CAPS Take 1,000 mg by mouth 2 (two) times daily.     [provider]  pantoprazole (PROTONIX) 20 MG tablet Take 20 mg by mouth daily after breakfast.     [provider]  prazosin (MINIPRESS) 2 MG capsule Take 2 mg by mouth at bedtime.    [provider]  testosterone cypionate (DEPO-TESTOSTERONE) 200 MG/ML injection Inject 0.25 mLs (50 mg total) into the muscle every 7 (seven) days. 04/06/16   Shawnee Knapp, MD    Family History Family History  Problem Relation Age of Onset  . Hypertension Father   . Diabetes Father   . Diabetes Sister   .  Diabetes Maternal Grandmother   . Cancer Other   . Diabetes Other     Social History Social History  Substance Use Topics  . Smoking status: Never Smoker  . Smokeless tobacco: Never Used  . Alcohol use Yes     Comment: Once every 2 weeks      Allergies   Metformin and related and Victoza [liraglutide]   Review of Systems Review of Systems  All other systems reviewed and are negative.    Physical Exam Updated Vital Signs BP (!) 147/78 (BP Location: Right Arm) Comment (BP Location): Lower  Pulse 98   Temp 97.9 F (36.6 C)  (Oral)   Resp 20   Ht 5\' 2"  (1.575 m)   Wt (!) 137 kg (302 lb)   SpO2 99%   BMI 55.24 kg/m   Physical Exam Nursing note and vitals reviewed.  Constitutional: Pt appears well-developed and well-nourished. No distress.  HENT:  Head: Normocephalic and atraumatic.  Eyes: Conjunctivae are normal.  Neck: Normal range of motion.  Cardiovascular: Normal rate, regular rhythm. Intact distal pulses.   Capillary refill < 3 sec.  Pulmonary/Chest: Effort normal and breath sounds normal.  Musculoskeletal:  LUE Pt exhibits tenderness to palpation about the left wrist and hand with mild swelling and depression over the dorsal aspect of the left hand without crepitus.   ROM: 4/5, limited by pain  Strength: 4/5, limited by pain  Neurological: Pt  is alert. Coordination normal.  Sensation: 5/5  Skin: Skin is warm and dry. Pt is not diaphoretic.  No evidence of open wound or skin tenting Psychiatric: Pt has a normal mood and affect.     ED Treatments / Results  Labs (all labs ordered are listed, but only abnormal results are displayed) Labs Reviewed  CBG MONITORING, ED - Abnormal; Notable for the following:       Result Value   Glucose-Capillary 317 (*)    All other components within normal limits    EKG  EKG Interpretation None       Radiology Dg Wrist Complete Left  Result Date: 07/16/2016 CLINICAL DATA:  Hand and wrist swelling and pain EXAM: LEFT WRIST - COMPLETE 3+ VIEW COMPARISON:  04/09/2016 FINDINGS: There is no evidence of fracture or dislocation. There is no evidence of arthropathy or other focal bone abnormality. Soft tissues are unremarkable. IMPRESSION: Negative. Electronically Signed   By: Donavan Foil M.D.   On: 07/16/2016 22:54   Dg Hand Complete Left  Result Date: 07/16/2016 CLINICAL DATA:  Hand and wrist swelling and pain EXAM: LEFT HAND - COMPLETE 3+ VIEW COMPARISON:  04/09/2016 FINDINGS: No fracture or malalignment.  Soft tissues are unremarkable. IMPRESSION:  No acute osseous abnormality Electronically Signed   By: Donavan Foil M.D.   On: 07/16/2016 22:53    Procedures Procedures (including critical care time)  Medications Ordered in ED Medications - No data to display   Initial Impression / Assessment and Plan / ED Course  I have reviewed the triage vital signs and the nursing notes.  Pertinent labs & imaging results that were available during my care of the patient were reviewed by me and considered in my medical decision making (see chart for details).     Patient with left hand and wrist pain with mild swelling. Will check plain films. No evidence of infection. Distal pulses are intact with brisk capillary refill and distal sensation.  Plain films are negative.  No evidence of fracture.  No evidence of infection.  Recommend PCP/Sports Medicine follow-up.  Patient X-Ray negative for obvious fracture or dislocation.  Patient given velcro splint while in ED, conservative therapy recommended and discussed. Patient will be discharged home & is agreeable with above plan. Returns precautions discussed. Pt appears safe for discharge.   Final Clinical Impressions(s) / ED Diagnoses   Final diagnoses:  Pain of left hand  Left wrist pain    New Prescriptions New Prescriptions   IBUPROFEN (ADVIL,MOTRIN) 600 MG TABLET    Take 1 tablet (600 mg total) by mouth every 6 (six) hours as needed.     Montine Circle, PA-C 07/16/16 2322    Tanna Furry, MD 07/27/16 [id number]

## 2016-09-22 ENCOUNTER — Encounter (HOSPITAL_COMMUNITY): Payer: Self-pay

## 2016-09-22 ENCOUNTER — Emergency Department (HOSPITAL_COMMUNITY)
Admission: EM | Admit: 2016-09-22 | Discharge: 2016-09-22 | Disposition: A | Discharge status: Home / Self Care | Payer: Medicaid Other | Attending: Emergency Medicine | Admitting: Emergency Medicine

## 2016-09-22 DIAGNOSIS — I10 Essential (primary) hypertension: Secondary | ICD-10-CM | POA: Diagnosis not present

## 2016-09-22 DIAGNOSIS — Z794 Long term (current) use of insulin: Secondary | ICD-10-CM | POA: Diagnosis not present

## 2016-09-22 DIAGNOSIS — E119 Type 2 diabetes mellitus without complications: Secondary | ICD-10-CM | POA: Diagnosis not present

## 2016-09-22 DIAGNOSIS — N644 Mastodynia: Secondary | ICD-10-CM

## 2016-09-22 DIAGNOSIS — Z79899 Other long term (current) drug therapy: Secondary | ICD-10-CM | POA: Insufficient documentation

## 2016-09-22 NOTE — ED Provider Notes (Signed)
[facility] DEPT Provider Note   CSN: [id number] Arrival date & time: 09/22/16  0121     History   Chief Complaint Chief Complaint  Patient presents with  . Breast Pain    HPI Dalyah Pla is a 26 y.o. female.  Patient presents to the ED with a chief complaint of breast discharge.  Patient is a female to female transgender, currently undergoing hormone replacement therapy.  He reports that he has had some discharge from his nipples for about a week.  He denies any fevers, chills, nausea or vomiting.  He has family history of breast cancer and does get frequent mammograms.  He denies any other associated symptoms.  There are no modifying factors.   The history is provided by the patient. No language interpreter was used.    Past Medical History:  Diagnosis Date  . Autism   . Diabetes mellitus without complication ([place])   . High cholesterol   . Hypertension     Patient Active Problem List   Diagnosis Date Noted  . Insulin dependent type 2 diabetes mellitus, uncontrolled ([place]) 04/06/2016  . High cholesterol 04/06/2016  . Hypertension 04/06/2016  . Endocrine disorder 04/06/2016  . Encounter for long-term current use of high risk medication 04/06/2016  . Obesity due to excess calories with serious comorbidity 04/06/2016  . Other mixed anxiety disorders 04/06/2016  . Depression 04/06/2016  . Female-to-female transgender person 04/06/2016  . PCOS (polycystic ovarian syndrome) 04/06/2016  . Vitamin D deficiency 04/06/2016    History reviewed. No pertinent surgical history.  OB History    No data available       Home Medications    Prior to Admission medications   Medication Sig Start Date End Date Taking? Authorizing Provider  busPIRone (BUSPAR) 10 MG tablet Take 10 mg by mouth 2 (two) times daily.    [provider]  Cholecalciferol (VITAMIN D3) 2000 units TABS Take 2,000 mg by mouth daily after breakfast.     [provider]  citalopram  (CELEXA) 40 MG tablet Take 40 mg by mouth every evening.     [provider]  hydrochlorothiazide (HYDRODIURIL) 25 MG tablet Take 1 tablet (25 mg total) by mouth daily. Patient taking differently: Take 25 mg by mouth daily after breakfast.  12/10/12   Reyne Dumas, MD  ibuprofen (ADVIL,MOTRIN) 600 MG tablet Take 1 tablet (600 mg total) by mouth every 6 (six) hours as needed. 07/16/16   Montine Circle, PA-C  insulin glargine (LANTUS) 100 UNIT/ML injection Inject 0.65 mLs (65 Units total) into the skin 2 (two) times daily. 07/07/16   Charlann Lange, PA-C  Lactobacillus (ACIDOPHILUS/BIFIDUS PO) Take 1 tablet by mouth 2 (two) times daily.     [provider]  Omega-3 Fatty Acids (FISH OIL) 1000 MG CAPS Take 1,000 mg by mouth 2 (two) times daily.     [provider]  pantoprazole (PROTONIX) 20 MG tablet Take 20 mg by mouth daily after breakfast.     [provider]  prazosin (MINIPRESS) 2 MG capsule Take 2 mg by mouth at bedtime.    [provider]  testosterone cypionate (DEPO-TESTOSTERONE) 200 MG/ML injection Inject 0.25 mLs (50 mg total) into the muscle every 7 (seven) days. 04/06/16   Shawnee Knapp, MD    Family History Family History  Problem Relation Age of Onset  . Hypertension Father   . Diabetes Father   . Diabetes Sister   . Diabetes Maternal Grandmother   . Cancer Other   .  Diabetes Other     Social History Social History  Substance Use Topics  . Smoking status: Never Smoker  . Smokeless tobacco: Never Used  . Alcohol use Yes     Comment: Once every 2 weeks      Allergies   Metformin and related and Victoza [liraglutide]   Review of Systems Review of Systems  All other systems reviewed and are negative.    Physical Exam Updated Vital Signs BP 135/87 (BP Location: Right Arm)   Pulse 93   Temp 97.8 F (36.6 C) (Oral)   Resp 18   Ht 5' 1.5" (1.562 m)   Wt 134.3 kg (296 lb)   SpO2 95%   BMI 55.02 kg/m   Physical Exam   Constitutional: She is oriented to person, place, and time. She appears well-developed and well-nourished.  HENT:  Head: Normocephalic and atraumatic.  Eyes: Pupils are equal, round, and reactive to light. Conjunctivae and EOM are normal.  Neck: Normal range of motion. Neck supple.  Cardiovascular: Normal rate and regular rhythm.  Exam reveals no gallop and no friction rub.   No murmur heard. Pulmonary/Chest: Effort normal and breath sounds normal. No respiratory distress. She has no wheezes. She has no rales. She exhibits no tenderness.  Chaperone present for breast exam, no nodules, no nipple discharge or sign of infection  Abdominal: Soft. Bowel sounds are normal. She exhibits no distension and no mass. There is no tenderness. There is no rebound and no guarding.  Musculoskeletal: Normal range of motion. She exhibits no edema or tenderness.  Neurological: She is alert and oriented to person, place, and time.  Skin: Skin is warm and dry.  Psychiatric: She has a normal mood and affect. Her behavior is normal. Judgment and thought content normal.  Nursing note and vitals reviewed.    ED Treatments / Results  Labs (all labs ordered are listed, but only abnormal results are displayed) Labs Reviewed - No data to display  EKG  EKG Interpretation None       Radiology No results found.  Procedures Procedures (including critical care time)  Medications Ordered in ED Medications - No data to display   Initial Impression / Assessment and Plan / ED Course  I have reviewed the triage vital signs and the nursing notes.  Pertinent labs & imaging results that were available during my care of the patient were reviewed by me and considered in my medical decision making (see chart for details).     Patient with reported nipple discharge.  No evidence of this now.  VSS.  Physical exam is unrevealing.  Encouraged patient to follow-up with PCP and inquire about getting mammogram or [place].   Patient understands and family agrees with plan.  Final Clinical Impressions(s) / ED Diagnoses   Final diagnoses:  Breast pain    New Prescriptions New Prescriptions   No medications on file     Montine Circle, Hershal Coria 09/22/16 0345    Molpus, Jenny Reichmann, MD 09/22/16 [id number]

## 2016-09-22 NOTE — Discharge Instructions (Signed)
Please contact your doctor tomorrow.  You will likely need to be scheduled for a mammogram or ultrasound.   Return to the ER for fever, redness, or worsening symptoms.

## 2016-09-22 NOTE — ED Triage Notes (Signed)
Pt presents with c/o breast pain. Pt is transitioning to a female and does take hormone replacements and reports that the blackness around the nipple area has been since the hormone replacement therapy. Pt reports the pain has always been there however.

## 2016-09-24 ENCOUNTER — Emergency Department (HOSPITAL_COMMUNITY)
Admission: EM | Admit: 2016-09-24 | Discharge: 2016-09-24 | Disposition: A | Discharge status: Home / Self Care | Payer: Medicaid Other | Attending: Emergency Medicine | Admitting: Emergency Medicine

## 2016-09-24 ENCOUNTER — Encounter (HOSPITAL_COMMUNITY): Payer: Self-pay

## 2016-09-24 DIAGNOSIS — I10 Essential (primary) hypertension: Secondary | ICD-10-CM | POA: Insufficient documentation

## 2016-09-24 DIAGNOSIS — R42 Dizziness and giddiness: Secondary | ICD-10-CM | POA: Diagnosis not present

## 2016-09-24 DIAGNOSIS — E860 Dehydration: Secondary | ICD-10-CM | POA: Insufficient documentation

## 2016-09-24 DIAGNOSIS — R110 Nausea: Secondary | ICD-10-CM | POA: Diagnosis present

## 2016-09-24 DIAGNOSIS — Z794 Long term (current) use of insulin: Secondary | ICD-10-CM | POA: Insufficient documentation

## 2016-09-24 DIAGNOSIS — R410 Disorientation, unspecified: Secondary | ICD-10-CM | POA: Insufficient documentation

## 2016-09-24 DIAGNOSIS — R739 Hyperglycemia, unspecified: Secondary | ICD-10-CM

## 2016-09-24 DIAGNOSIS — E1165 Type 2 diabetes mellitus with hyperglycemia: Secondary | ICD-10-CM | POA: Diagnosis not present

## 2016-09-24 DIAGNOSIS — R51 Headache: Secondary | ICD-10-CM | POA: Diagnosis not present

## 2016-09-24 DIAGNOSIS — F840 Autistic disorder: Secondary | ICD-10-CM | POA: Insufficient documentation

## 2016-09-24 LAB — URINALYSIS, ROUTINE W REFLEX MICROSCOPIC
BILIRUBIN URINE: NEGATIVE
HGB URINE DIPSTICK: NEGATIVE
Ketones, ur: NEGATIVE mg/dL
NITRITE: NEGATIVE
Protein, ur: NEGATIVE mg/dL
SPECIFIC GRAVITY, URINE: 1.027 (ref 1.005–1.030)
pH: 6 (ref 5.0–8.0)

## 2016-09-24 LAB — CBC
HEMATOCRIT: 44.3 % (ref 36.0–46.0)
Hemoglobin: 14.2 g/dL (ref 12.0–15.0)
MCH: 28.5 pg (ref 26.0–34.0)
MCHC: 32.1 g/dL (ref 30.0–36.0)
MCV: 88.8 fL (ref 78.0–100.0)
PLATELETS: 252 10*3/uL (ref 150–400)
RBC: 4.99 MIL/uL (ref 3.87–5.11)
RDW: 13.3 % (ref 11.5–15.5)
WBC: 13 10*3/uL — AB (ref 4.0–10.5)

## 2016-09-24 LAB — BASIC METABOLIC PANEL
Anion gap: 9 (ref 5–15)
BUN: 7 mg/dL (ref 6–20)
CALCIUM: 9 mg/dL (ref 8.9–10.3)
CO2: 30 mmol/L (ref 22–32)
CREATININE: 0.61 mg/dL (ref 0.44–1.00)
Chloride: 98 mmol/L — ABNORMAL LOW (ref 101–111)
GFR calc Af Amer: >60 mL/min (ref >60)
GLUCOSE: 300 mg/dL — AB (ref 65–99)
POTASSIUM: 3.3 mmol/L — AB (ref 3.5–5.1)
SODIUM: 137 mmol/L (ref 135–145)

## 2016-09-24 LAB — CBG MONITORING, ED
GLUCOSE-CAPILLARY: 279 mg/dL — AB (ref 65–99)
Glucose-Capillary: 151 mg/dL — ABNORMAL HIGH (ref 65–99)

## 2016-09-24 MED ORDER — LACTATED RINGERS IV BOLUS (SEPSIS)
1000.0000 mL | Freq: Once | INTRAVENOUS | Status: AC
  Administered 2016-09-24: 1000 mL via INTRAVENOUS

## 2016-09-24 NOTE — ED Triage Notes (Signed)
Pt presents to the ed for complaints of high blood sugars through out the night. Last night it was up to 300 and today has been in the 200's.  Per family the pt blood sugar is usually normal and he has been taking his insulin like normal.  Pt also complains of a headache and feeling confused.

## 2016-09-24 NOTE — ED Notes (Signed)
ED Provider at bedside. 

## 2016-09-24 NOTE — ED Provider Notes (Signed)
[facility] DEPT Provider Note   CSN: [id number] Arrival date & time: 09/24/16  1720   History   Chief Complaint Chief Complaint  Patient presents with  . Hyperglycemia    HPI Krista Williams is a 26 y.o. female.  This is a 26 year old female to female transgender who presents with nausea, dehydration, and concern for confusion earlier today.  Wife is present as well. Wife states earlier this afternoon patient appeared to have acute episode of confusion.  Patient agrees he had difficulty ambulating and favored his right side when walking. Stated he felt lightheaded. Denies fevers, vomiting, abdominal pain, urinary symptoms, change in bowel movements, numbness and tingling in extremities.  Last hemoglobin A1c was last year which was 11.  Insulin dose has been changed within the past month and wife states she has kept close record of sugars on her phone which show sugars less than 200s.  She was concerned because his sugars at home earlier today were in the 300s. No recent travel, no dyspnea, no chest pain. Wife denies any episodes of slurred speech, facial asymmetry.  No prior history of stroke or heart disease.   The history is provided by the patient and the spouse.    Past Medical History:  Diagnosis Date  . Autism   . Diabetes mellitus without complication ([place])   . High cholesterol   . Hypertension     Patient Active Problem List   Diagnosis Date Noted  . Insulin dependent type 2 diabetes mellitus, uncontrolled ([place]) 04/06/2016  . High cholesterol 04/06/2016  . Hypertension 04/06/2016  . Endocrine disorder 04/06/2016  . Encounter for long-term current use of high risk medication 04/06/2016  . Obesity due to excess calories with serious comorbidity 04/06/2016  . Other mixed anxiety disorders 04/06/2016  . Depression 04/06/2016  . Female-to-female transgender person 04/06/2016  . PCOS (polycystic ovarian syndrome) 04/06/2016  . Vitamin D deficiency 04/06/2016     History reviewed. No pertinent surgical history.  OB History    No data available       Home Medications    Prior to Admission medications   Medication Sig Start Date End Date Taking? Authorizing Provider  ALPRAZolam Duanne Moron) 0.5 MG tablet Take 0.5 mg by mouth 2 (two) times a week. ON DAYS WITH THE PUBLIC 9/89/21  Yes [provider]  amLODipine (NORVASC) 10 MG tablet Take 10 mg by mouth daily. 09/13/16  Yes [provider]  busPIRone (BUSPAR) 10 MG tablet Take 10 mg by mouth 2 (two) times daily.   Yes [provider]  citalopram (CELEXA) 40 MG tablet Take 40 mg by mouth every evening.    Yes [provider]  Dulaglutide (TRULICITY) 1.5 JH/4.1DE SOPN Inject 1.5 mg into the skin every Thursday.    Yes [provider]  hydrochlorothiazide (HYDRODIURIL) 25 MG tablet Take 1 tablet (25 mg total) by mouth daily. Patient taking differently: Take 25 mg by mouth daily after breakfast.  12/10/12  Yes Reyne Dumas, MD  hydroxypropyl methylcellulose / hypromellose (ISOPTO TEARS / GONIOVISC) 2.5 % ophthalmic solution Place 2 drops into both eyes 3 (three) times daily as needed for dry eyes.   Yes [provider]  ibuprofen (ADVIL,MOTRIN) 600 MG tablet Take 1 tablet (600 mg total) by mouth every 6 (six) hours as needed. Patient taking differently: Take 600 mg by mouth every 6 (six) hours as needed (for pain).  07/16/16  Yes Montine Circle, PA-C  insulin glargine (LANTUS) 100 UNIT/ML injection Inject 0.65 mLs (65  Units total) into the skin 2 (two) times daily. 07/07/16  Yes Upstill, Shari, PA-C  INVOKANA 300 MG TABS tablet Take 300 mg by mouth daily. 08/03/16  Yes [provider]  pantoprazole (PROTONIX) 20 MG tablet Take 20 mg by mouth daily after breakfast.    Yes [provider]  prazosin (MINIPRESS) 2 MG capsule Take 2 mg by mouth at bedtime.   Yes [provider]  testosterone cypionate (DEPO-TESTOSTERONE) 200 MG/ML  injection Inject 0.25 mLs (50 mg total) into the muscle every 7 (seven) days. Patient taking differently: Inject 100 mg into the muscle every 14 (fourteen) days.  04/06/16  Yes Shawnee Knapp, MD  UNABLE TO FIND CPAP: Nightly at bedtime   Yes [provider]    Family History Family History  Problem Relation Age of Onset  . Hypertension Father   . Diabetes Father   . Diabetes Sister   . Diabetes Maternal Grandmother   . Cancer Other   . Diabetes Other     Social History Social History  Substance Use Topics  . Smoking status: Never Smoker  . Smokeless tobacco: Never Used  . Alcohol use Yes     Comment: Once every 2 weeks      Allergies   Penicillins; Lactose intolerance (gi); Metformin and related; and Victoza [liraglutide]   Review of Systems Review of Systems  Constitutional: Negative for appetite change, chills, diaphoresis and fever.  HENT: Negative for ear pain and sore throat.   Eyes: Negative for pain and visual disturbance.  Respiratory: Negative for cough and shortness of breath.   Cardiovascular: Negative for chest pain and palpitations.  Gastrointestinal: Negative for abdominal pain and vomiting.  Genitourinary: Negative for dysuria and hematuria.  Musculoskeletal: Negative for arthralgias and back pain.  Skin: Negative for color change and rash.  Neurological: Positive for dizziness, light-headedness and headaches. Negative for tremors, seizures, syncope, facial asymmetry, speech difficulty, weakness and numbness.  Psychiatric/Behavioral: Positive for confusion.  All other systems reviewed and are negative.    Physical Exam Updated Vital Signs BP 131/69   Pulse 90   Temp 98 F (36.7 C)   Resp (!) 25   Wt 134.3 kg (296 lb)   SpO2 97%   BMI 55.02 kg/m   Physical Exam  Constitutional: She is oriented to person, place, and time. No distress.  HENT:  Head: Normocephalic and atraumatic.  Eyes: Pupils are equal, round, and reactive to light.  Conjunctivae and EOM are normal.  Neck: Neck supple.  Cardiovascular: Normal rate and regular rhythm.   No murmur heard. Pulmonary/Chest: Effort normal and breath sounds normal. No respiratory distress.  Abdominal: Soft. There is no tenderness.  Musculoskeletal: She exhibits no edema.  Neurological: She is alert and oriented to person, place, and time. She has normal strength. She displays no tremor. No cranial nerve deficit or sensory deficit. She exhibits normal muscle tone. She displays no seizure activity. Coordination and gait normal.  Patient able to ambulate down the hall without assistance, no ataxia noted.  Patient has good coordination, no dysmetria noted.  Rapid alternating movements unremarkable, heel to shin test unremarkable.  Skin: Skin is warm and dry. Capillary refill takes less than 2 seconds. No rash noted. She is not diaphoretic.  Psychiatric: She has a normal mood and affect.  Nursing note and vitals reviewed.    ED Treatments / Results  Labs (all labs ordered are listed, but only abnormal results are displayed) Labs Reviewed  BASIC METABOLIC PANEL -  Abnormal; Notable for the following:       Result Value   Potassium 3.3 (*)    Chloride 98 (*)    Glucose, Bld 300 (*)    All other components within normal limits  CBC - Abnormal; Notable for the following:    WBC 13.0 (*)    All other components within normal limits  URINALYSIS, ROUTINE W REFLEX MICROSCOPIC - Abnormal; Notable for the following:    Color, Urine STRAW (*)    Glucose, UA >=500 (*)    Leukocytes, UA MODERATE (*)    Bacteria, UA RARE (*)    Squamous Epithelial / LPF 0-5 (*)    All other components within normal limits  CBG MONITORING, ED - Abnormal; Notable for the following:    Glucose-Capillary 279 (*)    All other components within normal limits  CBG MONITORING, ED - Abnormal; Notable for the following:    Glucose-Capillary 151 (*)    All other components within normal limits    EKG  EKG  Interpretation  Date/Time:  Monday September 24 2016 19:31:25 EDT Ventricular Rate:  89 PR Interval:    QRS Duration: 84 QT Interval:  366 QTC Calculation: 446 R Axis:   44 Text Interpretation:  Sinus rhythm Borderline Q waves in inferior leads Borderline T abnormalities, inferior leads since last tracing no significant change Confirmed by Noemi Chapel [id number]) on 09/24/2016 7:36:19 PM       Radiology No results found.  Procedures Procedures (including critical care time)  Medications Ordered in ED Medications  lactated ringers bolus 1,000 mL (1,000 mLs Intravenous New Bag/Given 09/24/16 1939)     Initial Impression / Assessment and Plan / ED Course  I have reviewed the triage vital signs and the nursing notes.  Pertinent labs & imaging results that were available during my care of the patient were reviewed by me and considered in my medical decision making (see chart for details).     This is a 26 year old female to female transgender who presents with nausea, dehydration, and concern for confusion earlier today.    Exam as above, patient neurologically intact. Low concern at this time for cerebellar pathological process including stroke. BP on evaluation in triage in the 130s and has not changed during course in the ED. Low concern for hypertensive emergency at this time.  Laboratory studies reviewed.  WBC count 13 consistent with last 3 visits.  BMP shows no major abnormalities except for hyperglycemia at 300. Urine studies unremarkable for infectious etiology.  Patient likely dehydrated with hyperglycemia.  Patient given 1 L lactated Ringer's with repeat blood glucose measured at 151. Patient reports much improvement.   Given reassuring clinical presentation, interactive patient who denies any red flag symptoms as stated above, no further diagnostic workup indicated this time.  Patient has no evidence of being in DKA given no anion gap, no ketones in the urine, glucose  responding well to IV hydration.  Encouraged outpatient follow-up as scheduled with PCP and continued close monitoring of blood glucose for tighter control of patient's T2 DM. Return precautions given, all questions answered.  Final Clinical Impressions(s) / ED Diagnoses   Final diagnoses:  Hyperglycemia  Dehydration   New Prescriptions New Prescriptions   No medications on file     Aldona Lento, MD 09/24/16 2053    Noemi Chapel, MD 09/25/16 [phone]

## 2016-09-24 NOTE — ED Provider Notes (Signed)
I saw and evaluated the patient, reviewed the resident's note and I agree with the findings and plan.  Pertinent History: The patient is a morbidly obese 26 year old patient who has a history of diabetes and states that last night he felt uncomfortable with depression and thus went to the store to buy doughnuts, 83 donuts and then today was concerned because blood sugar was over 300. On exam the patient has no complaints, is feeling better after IV fluids  Pertinent Exam findings: On exam the patient has a soft nontender abdomen, no tachycardia, otherwise well-appearing.  No signs of metabolic derangement to be concern for diabetic ketoacidosis, the patient has been given fluids and appears well, stable for discharge, counseled on insulin use and dietary restrictions, Expressed understanding  I personally interpreted the EKG as well as the resident and agree with the interpretation on the resident's chart.   EKG Interpretation  Date/Time:  Monday September 24 2016 19:31:25 EDT Ventricular Rate:  89 PR Interval:    QRS Duration: 84 QT Interval:  366 QTC Calculation: 446 R Axis:   44 Text Interpretation:  Sinus rhythm Borderline Q waves in inferior leads Borderline T abnormalities, inferior leads since last tracing no significant change Confirmed by Noemi Chapel [phone]) on 09/24/2016 7:36:19 PM        Final diagnoses:  Hyperglycemia  Dehydration      Noemi Chapel, MD 09/25/16 [phone]

## 2016-10-11 ENCOUNTER — Other Ambulatory Visit: Payer: Self-pay | Admitting: Physician Assistant

## 2016-10-11 DIAGNOSIS — N6452 Nipple discharge: Secondary | ICD-10-CM

## 2016-10-11 DIAGNOSIS — N644 Mastodynia: Secondary | ICD-10-CM

## 2016-10-16 ENCOUNTER — Other Ambulatory Visit: Payer: No Typology Code available for payment source

## 2016-10-17 ENCOUNTER — Ambulatory Visit
Admission: RE | Admit: 2016-10-17 | Discharge: 2016-10-17 | Disposition: A | Discharge status: Home / Self Care | Payer: Medicaid Other | Source: Ambulatory Visit | Attending: Physician Assistant | Admitting: Physician Assistant

## 2016-10-17 DIAGNOSIS — N644 Mastodynia: Secondary | ICD-10-CM

## 2016-10-17 DIAGNOSIS — N6452 Nipple discharge: Secondary | ICD-10-CM

## 2016-10-21 ENCOUNTER — Emergency Department (HOSPITAL_COMMUNITY)
Admission: EM | Admit: 2016-10-21 | Discharge: 2016-10-21 | Disposition: A | Discharge status: Home / Self Care | Payer: Medicaid Other | Attending: Emergency Medicine | Admitting: Emergency Medicine

## 2016-10-21 ENCOUNTER — Encounter (HOSPITAL_COMMUNITY): Payer: Self-pay | Admitting: Emergency Medicine

## 2016-10-21 DIAGNOSIS — Z794 Long term (current) use of insulin: Secondary | ICD-10-CM | POA: Insufficient documentation

## 2016-10-21 DIAGNOSIS — Z79899 Other long term (current) drug therapy: Secondary | ICD-10-CM | POA: Diagnosis not present

## 2016-10-21 DIAGNOSIS — E119 Type 2 diabetes mellitus without complications: Secondary | ICD-10-CM | POA: Insufficient documentation

## 2016-10-21 DIAGNOSIS — F840 Autistic disorder: Secondary | ICD-10-CM | POA: Diagnosis not present

## 2016-10-21 DIAGNOSIS — N644 Mastodynia: Secondary | ICD-10-CM | POA: Insufficient documentation

## 2016-10-21 DIAGNOSIS — I10 Essential (primary) hypertension: Secondary | ICD-10-CM | POA: Insufficient documentation

## 2016-10-21 NOTE — ED Provider Notes (Signed)
[facility] DEPT Provider Note   CSN: [id number] Arrival date & time: 10/21/16  1809     History   Chief Complaint Chief Complaint  Patient presents with  . Breast Pain    HPI Krista Williams is a 26 y.o. Female currently transitioning to female who prefers to go by "Krista Williams", with a PMHx of DM2, HTN, HLD, obesity, anxiety, PCOS, vitamin D deficiency, and autism, who presents to the ED accompanied by his wife, with complaints of ongoing persistent b/l breast pain, nipple "indenting", nipple discoloration, and nipple drainage x1 month. Patient states that he has been on testosterone for 6 months, but hadn't noticed any breast changes until one month ago. They first noticed a lump on the right lateral breast, followed by "indenting" of the nipples bilaterally, and a brown/darker coloration to his nipples. He describes the pain as 10/10 constant sharp nonradiating bilateral nipple/breast pain, worse with movement, and with no treatments tried prior to arrival. He also reports nipple drainage bilaterally, occasionally bloody but at other times it is gray or clear and liquidy. He also mentions having nausea which is a chronic issue and has not changed. Chart review reveals she was seen in the ED on 09/22/16 for similar complaint, no exam findings at that time, advised to f/up with PCP for outpatient mammogram or ultrasound. He underwent bilateral breast ultrasound at the [facility] Center on 10/17/16 but felt that the staff was very rude to him, and were only told that "it was likely hormone related" and then sent home. They didn't feel like they got any clear answers that day about the ultrasound results.   He denies any erythema or warmth to the breasts, rashes, itching, fevers, chills, CP, SOB, abd pain, V/D/C, hematuria, dysuria, myalgias, arthralgias, numbness, tingling, focal weakness, or any other complaints at this time.    The history is provided by the patient, medical records and the spouse. No  language interpreter was used.    Past Medical History:  Diagnosis Date  . Autism   . Diabetes mellitus without complication ([place])   . High cholesterol   . Hypertension     Patient Active Problem List   Diagnosis Date Noted  . Insulin dependent type 2 diabetes mellitus, uncontrolled ([place]) 04/06/2016  . High cholesterol 04/06/2016  . Hypertension 04/06/2016  . Endocrine disorder 04/06/2016  . Encounter for long-term current use of high risk medication 04/06/2016  . Obesity due to excess calories with serious comorbidity 04/06/2016  . Other mixed anxiety disorders 04/06/2016  . Depression 04/06/2016  . Female-to-female transgender person 04/06/2016  . PCOS (polycystic ovarian syndrome) 04/06/2016  . Vitamin D deficiency 04/06/2016    History reviewed. No pertinent surgical history.  OB History    No data available       Home Medications    Prior to Admission medications   Medication Sig Start Date End Date Taking? Authorizing Provider  ALPRAZolam Duanne Moron) 0.5 MG tablet Take 0.5 mg by mouth 2 (two) times a week. ON DAYS WITH THE PUBLIC 3/53/29   [provider]  amLODipine (NORVASC) 10 MG tablet Take 10 mg by mouth daily. 09/13/16   [provider]  busPIRone (BUSPAR) 10 MG tablet Take 10 mg by mouth 2 (two) times daily.    [provider]  citalopram (CELEXA) 40 MG tablet Take 40 mg by mouth every evening.     [provider]  Dulaglutide (TRULICITY) 1.5 JM/4.2AS SOPN Inject 1.5 mg into the skin every Thursday.  [provider]  hydrochlorothiazide (HYDRODIURIL) 25 MG tablet Take 1 tablet (25 mg total) by mouth daily. Patient taking differently: Take 25 mg by mouth daily after breakfast.  12/10/12   Reyne Dumas, MD  hydroxypropyl methylcellulose / hypromellose (ISOPTO TEARS / GONIOVISC) 2.5 % ophthalmic solution Place 2 drops into both eyes 3 (three) times daily as needed for dry eyes.    [provider]  ibuprofen  (ADVIL,MOTRIN) 600 MG tablet Take 1 tablet (600 mg total) by mouth every 6 (six) hours as needed. Patient taking differently: Take 600 mg by mouth every 6 (six) hours as needed (for pain).  07/16/16   Montine Circle, PA-C  insulin glargine (LANTUS) 100 UNIT/ML injection Inject 0.65 mLs (65 Units total) into the skin 2 (two) times daily. 07/07/16   Upstill, Shari, PA-C  INVOKANA 300 MG TABS tablet Take 300 mg by mouth daily. 08/03/16   [provider]  pantoprazole (PROTONIX) 20 MG tablet Take 20 mg by mouth daily after breakfast.     [provider]  prazosin (MINIPRESS) 2 MG capsule Take 2 mg by mouth at bedtime.    [provider]  testosterone cypionate (DEPO-TESTOSTERONE) 200 MG/ML injection Inject 0.25 mLs (50 mg total) into the muscle every 7 (seven) days. Patient taking differently: Inject 100 mg into the muscle every 14 (fourteen) days.  04/06/16   Shawnee Knapp, MD  UNABLE TO FIND CPAP: Nightly at bedtime    [provider]    Family History Family History  Problem Relation Age of Onset  . Hypertension Father   . Diabetes Father   . Diabetes Sister   . Diabetes Maternal Grandmother   . Breast cancer Maternal Grandmother   . Cancer Other   . Diabetes Other   . Breast cancer Paternal Grandmother     Social History Social History  Substance Use Topics  . Smoking status: Never Smoker  . Smokeless tobacco: Never Used  . Alcohol use Yes     Comment: Once every 2 weeks      Allergies   Penicillins; Lactose intolerance (gi); Metformin and related; and Victoza [liraglutide]   Review of Systems Review of Systems  Constitutional: Negative for chills and fever.  Respiratory: Negative for shortness of breath.   Cardiovascular: Negative for chest pain.  Gastrointestinal: Positive for nausea (chronic issue). Negative for abdominal pain, constipation, diarrhea and vomiting.  Genitourinary: Negative for dysuria and hematuria.  Musculoskeletal:  Negative for arthralgias and myalgias.  Skin: Positive for color change. Negative for rash.       +nipple pain/darkening/retraction/discharge +"lump" on R breast  Allergic/Immunologic: Positive for immunocompromised state (DM2).  Neurological: Negative for weakness and numbness.  Psychiatric/Behavioral: Negative for confusion.   All other systems reviewed and are negative for acute change except as noted in the HPI.    Physical Exam Updated Vital Signs BP (!) 150/70 (BP Location: Right Arm)   Pulse 75   Temp 98.6 F (37 C) (Oral)   Resp 18   SpO2 100%   Physical Exam  Constitutional: She is oriented to person, place, and time. Vital signs are normal. She appears well-developed and well-nourished.  Non-toxic appearance. No distress.  Afebrile, nontoxic, NAD  HENT:  Head: Normocephalic and atraumatic.  Mouth/Throat: Mucous membranes are normal.  Hirsutism  Eyes: Conjunctivae and EOM are normal. Right eye exhibits no discharge. Left eye exhibits no discharge.  Neck: Normal range of motion. Neck supple.  Cardiovascular: Normal rate and intact distal pulses.  Pulmonary/Chest: Effort normal. No respiratory distress. Right breast exhibits tenderness. Right breast exhibits no inverted nipple, no mass, no nipple discharge and no skin change. Left breast exhibits tenderness. Left breast exhibits no inverted nipple, no mass, no nipple discharge and no skin change.  Chaperone present for exam R breast with small lymph node in lateral pec/axillary chain which is mildly tender and mobile. Mild tenderness to areolar area of both breasts, and diffuse breast tenderness bilaterally but mostly around the nipple area. No nipple inversion bilaterally, no nipple discharge able to be expressed bilaterally. L areola just slightly darker pigmented. No masses, no areas of induration or fluctuance, no overlying skin changes/ulcerations/peau d'orange appearance. No erythema or warmth of either breast.     Abdominal: Normal appearance. She exhibits no distension.  Musculoskeletal: Normal range of motion.  Neurological: She is alert and oriented to person, place, and time. She has normal strength. No sensory deficit.  Skin: Skin is warm, dry and intact. No rash noted.  Psychiatric: She has a normal mood and affect. Her behavior is normal.  Nursing note and vitals reviewed.    ED Treatments / Results  Labs (all labs ordered are listed, but only abnormal results are displayed) Labs Reviewed - No data to display  EKG  EKG Interpretation None       Radiology No results found.   U/S Breast bilateral 10/17/16 Study Result: CLINICAL DATA:  Patient presents for evaluation of bilateral nipple discharge. Note patient began hormonal therapy 04/2016.  EXAM: ULTRASOUND OF THE BILATERAL BREAST  COMPARISON:  None  FINDINGS: On physical exam, I am unable to elicit discharge from either the left or right breast.  Targeted ultrasound is performed, showing no discrete retroareolar mass or dilated duct within the right or left breast.  IMPRESSION: No discharge was able to be elicited on current evaluation. Discharged as described is likely physiologic.  No suspicious retroareolar abnormality on ultrasound.  RECOMMENDATION: Continued clinical evaluation for reported bilateral nipple discharge. I instructed the patient to return for additional evaluation in 3 months if the discharge persists or appears to increased in volume.  I have discussed the findings and recommendations with the patient. Results were also provided in writing at the conclusion of the visit. If applicable, a reminder letter will be sent to the patient regarding the next appointment.  BI-RADS CATEGORY  2: Benign.   Electronically Signed   By: Lovey Newcomer M.D.   On: 10/17/2016 12:53     Procedures Procedures (including critical care time)  Medications Ordered in ED Medications - No data to  display   Initial Impression / Assessment and Plan / ED Course  I have reviewed the triage vital signs and the nursing notes.  Pertinent labs & imaging results that were available during my care of the patient were reviewed by me and considered in my medical decision making (see chart for details).     26 y.o. female here with c/o b/l breast pain x1 month with associated darkening of nipple, inversion of nipple, nipple drainage, and a lump palpated on the R breast. Was seen 09/22/16 in ED for same complaint, exam reassuring at that time, advised to f/up with PCP. Ultimately had b/l breast ultrasound which showed no abnormalities and no drainage was able to be expressed. Today, exam also reassuring, small lymph node felt in R axillary/lateral pec area, mobile and mildly tender, no overlying skin changes, no nipple inversion or drainage; mild tenderness to areolar regions; slight darkening of L  nipple. No warmth/erythema. No evidence of mastitis or breast abscess. I suspect this is all physiologic related to hormones. Doubt need for further emergent work up. Advised warm compresses, tylenol/motrin for pain, supportive bra, and f/up with her PCP or whomever is taking care of her transitioning. They may want to consider getting a prolactin level, that may be beneficial as an outpatient. I explained the diagnosis and have given explicit precautions to return to the ER including for any other new or worsening symptoms. The patient understands and accepts the medical plan as it's been dictated and I have answered their questions. Discharge instructions concerning home care and prescriptions have been given. The patient is STABLE and is discharged to home in good condition.    Final Clinical Impressions(s) / ED Diagnoses   Final diagnoses:  Breast pain    New Prescriptions New Prescriptions   No medications on [address] [place], [place] 10/21/16 2040    Varney Biles, MD 10/22/16 0010

## 2016-10-21 NOTE — ED Triage Notes (Signed)
Patient c/o bilateral breast pain x1 month. Patient reports "nipples are black and draining." Denies fevers.

## 2016-10-21 NOTE — ED Notes (Signed)
Bed: WA03 Expected date:  Expected time:  Means of arrival:  Comments: 

## 2016-10-21 NOTE — Discharge Instructions (Signed)
Your exam today was reassuring, and your recent breast ultrasound was also reassuring. Your symptoms are likely hormone related. You will want to follow up with your regular doctor or whomever is transitioning you in order to discuss this issue and see if any changes in your hormone medications needs to be done. They may also want to check other labs, such as a prolactin level, or anything else they deem necessary for your evaluation. Use a supportive bra, use warm compresses to help with pain, alternate between tylenol and motrin as needed for pain, and follow up with your regular doctor in 1 week for recheck of symptoms and ongoing management of your medical conditions. Return to the ER for emergent changes or worsening symptoms.

## 2017-01-31 ENCOUNTER — Emergency Department (HOSPITAL_BASED_OUTPATIENT_CLINIC_OR_DEPARTMENT_OTHER)
Admission: EM | Admit: 2017-01-31 | Discharge: 2017-01-31 | Disposition: A | Discharge status: Home / Self Care | Payer: Medicaid Other | Attending: Emergency Medicine | Admitting: Emergency Medicine

## 2017-01-31 ENCOUNTER — Other Ambulatory Visit: Payer: Self-pay

## 2017-01-31 ENCOUNTER — Encounter (HOSPITAL_BASED_OUTPATIENT_CLINIC_OR_DEPARTMENT_OTHER): Payer: Self-pay | Admitting: *Deleted

## 2017-01-31 DIAGNOSIS — F840 Autistic disorder: Secondary | ICD-10-CM | POA: Insufficient documentation

## 2017-01-31 DIAGNOSIS — Z794 Long term (current) use of insulin: Secondary | ICD-10-CM | POA: Insufficient documentation

## 2017-01-31 DIAGNOSIS — M25531 Pain in right wrist: Secondary | ICD-10-CM | POA: Insufficient documentation

## 2017-01-31 DIAGNOSIS — E119 Type 2 diabetes mellitus without complications: Secondary | ICD-10-CM | POA: Diagnosis not present

## 2017-01-31 DIAGNOSIS — I10 Essential (primary) hypertension: Secondary | ICD-10-CM | POA: Insufficient documentation

## 2017-01-31 DIAGNOSIS — Z79899 Other long term (current) drug therapy: Secondary | ICD-10-CM | POA: Insufficient documentation

## 2017-01-31 MED ORDER — PREDNISONE 10 MG PO TABS: 20.0000 mg | ORAL_TABLET | Freq: Every day | ORAL | 0 refills | Status: AC

## 2017-01-31 MED ORDER — DICLOFENAC SODIUM 1 % TD GEL: 2.0000 g | Freq: Four times a day (QID) | TRANSDERMAL | 0 refills | Status: DC

## 2017-01-31 NOTE — Discharge Instructions (Signed)
Wear the wrist splint when able. Take prednisone as prescribed.  Make sure you are checking her blood sugars and blood pressure.  If they start to rise, stop taking the prednisone. Not take anti-inflammatories while taking prednisone, this includes ibuprofen, Motrin, Aleve, naproxen.  You may supplement with Tylenol if you need further pain control. Use Voltaren gel as needed for pain. Follow-up with your primary care doctor next week if pain is not improving. Return to the emergency room if you develop numbness, redness, fevers, or any new or concerning symptoms.

## 2017-01-31 NOTE — ED Notes (Signed)
No edema noted upon assessment  Pt. Has normal skin color and temp is equal to L arm temp.  R fingers are WNL and cap refill is WNL.  Pt. Palm of hand WNL no noted abrasions and no injury or trauma noted.

## 2017-01-31 NOTE — ED Provider Notes (Signed)
[facility] EMERGENCY DEPARTMENT Provider Note   CSN: [id number] Arrival date & time: 01/31/17  1708     History   Chief Complaint Chief Complaint  Patient presents with  . Arm Pain    HPI Krista Williams is a 27 y.o. adult presenting for evaluation of right arm pain.  Pt states he was moving and lifting boxes last weekend.  Since then, he has had right forearm pain.  The pain begins at the wrist and shoots up his arm to his elbow.  He has been taking naproxen and Aleve without improvement.  He reports having broken his wrist twice in the past.  He describes the pain is constant with occasional spasms.  Movement and palpation makes the pain worse, nothing makes it better.  He followed up with his primary care doctor who prescribed him Flexeril which has not improved his symptoms.  He denies injury or pain elsewhere.  He denies numbness, tingling.  HPI  Past Medical History:  Diagnosis Date  . Autism   . Diabetes mellitus without complication ([place])   . High cholesterol   . Hypertension     Patient Active Problem List   Diagnosis Date Noted  . Insulin dependent type 2 diabetes mellitus, uncontrolled ([place]) 04/06/2016  . High cholesterol 04/06/2016  . Hypertension 04/06/2016  . Endocrine disorder 04/06/2016  . Encounter for long-term current use of high risk medication 04/06/2016  . Obesity due to excess calories with serious comorbidity 04/06/2016  . Other mixed anxiety disorders 04/06/2016  . Depression 04/06/2016  . Female-to-female transgender person 04/06/2016  . PCOS (polycystic ovarian syndrome) 04/06/2016  . Vitamin D deficiency 04/06/2016    History reviewed. No pertinent surgical history.  OB History    No data available       Home Medications    Prior to Admission medications   Medication Sig Start Date End Date Taking? Authorizing Provider  FLUoxetine HCl (PROZAC PO) Take by mouth.   Yes [provider]  insulin aspart (NOVOLOG) 100  UNIT/ML injection Inject into the skin 3 (three) times daily before meals.   Yes [provider]  LamoTRIgine (LAMICTAL PO) Take by mouth.   Yes [provider]  ALPRAZolam Duanne Moron) 0.5 MG tablet Take 0.5 mg by mouth 2 (two) times a week. ON DAYS WITH THE PUBLIC 0/86/76   [provider]  amLODipine (NORVASC) 10 MG tablet Take 10 mg by mouth daily. 09/13/16   [provider]  busPIRone (BUSPAR) 10 MG tablet Take 10 mg by mouth 2 (two) times daily.    [provider]  citalopram (CELEXA) 40 MG tablet Take 40 mg by mouth every evening.     [provider]  diclofenac sodium (VOLTAREN) 1 % GEL Apply 2 g topically 4 (four) times daily. 01/31/17   Ophelia Sipe, PA-C  Dulaglutide (TRULICITY) 1.5 PP/5.0DT SOPN Inject 1.5 mg into the skin every Thursday.     [provider]  hydrochlorothiazide (HYDRODIURIL) 25 MG tablet Take 1 tablet (25 mg total) by mouth daily. Patient taking differently: Take 25 mg by mouth daily after breakfast.  12/10/12   Reyne Dumas, MD  hydroxypropyl methylcellulose / hypromellose (ISOPTO TEARS / GONIOVISC) 2.5 % ophthalmic solution Place 2 drops into both eyes 3 (three) times daily as needed for dry eyes.    [provider]  ibuprofen (ADVIL,MOTRIN) 600 MG tablet Take 1 tablet (600 mg total) by mouth every 6 (six) hours as needed. Patient taking differently: Take 600 mg  by mouth every 6 (six) hours as needed (for pain).  07/16/16   Montine Circle, PA-C  insulin glargine (LANTUS) 100 UNIT/ML injection Inject 0.65 mLs (65 Units total) into the skin 2 (two) times daily. 07/07/16   Upstill, Shari, PA-C  INVOKANA 300 MG TABS tablet Take 300 mg by mouth daily. 08/03/16   [provider]  pantoprazole (PROTONIX) 20 MG tablet Take 20 mg by mouth daily after breakfast.     [provider]  prazosin (MINIPRESS) 2 MG capsule Take 2 mg by mouth at bedtime.    [provider]  predniSONE  (DELTASONE) 10 MG tablet Take 2 tablets (20 mg total) by mouth daily for 4 days. 01/31/17 02/04/17  Canden Cieslinski, PA-C  testosterone cypionate (DEPO-TESTOSTERONE) 200 MG/ML injection Inject 0.25 mLs (50 mg total) into the muscle every 7 (seven) days. Patient taking differently: Inject 100 mg into the muscle every 14 (fourteen) days.  04/06/16   Shawnee Knapp, MD  UNABLE TO FIND CPAP: Nightly at bedtime    [provider]    Family History Family History  Problem Relation Age of Onset  . Hypertension Father   . Diabetes Father   . Diabetes Sister   . Diabetes Maternal Grandmother   . Breast cancer Maternal Grandmother   . Cancer Other   . Diabetes Other   . Breast cancer Paternal Grandmother     Social History Social History   Tobacco Use  . Smoking status: Never Smoker  . Smokeless tobacco: Never Used  Substance Use Topics  . Alcohol use: Yes    Comment: Once every 2 weeks   . Drug use: No     Allergies   Penicillins; Lactose intolerance (gi); Metformin and related; and Victoza [liraglutide]   Review of Systems Review of Systems  Musculoskeletal: Positive for arthralgias and myalgias.  Neurological: Negative for numbness.     Physical Exam Updated Vital Signs BP (!) 155/95 (BP Location: Left Arm)   Pulse 92   Temp 97.9 F (36.6 C) (Oral)   Resp 18   Ht 5\' 1"  (1.549 m)   Wt (!) 141.1 kg (311 lb)   SpO2 98%   BMI 58.76 kg/m   Physical Exam  Constitutional: He is oriented to person, place, and time. He appears well-developed and well-nourished. No distress.  HENT:  Head: Normocephalic and atraumatic.  Eyes: EOM are normal.  Neck: Normal range of motion.  Pulmonary/Chest: Effort normal.  Abdominal: He exhibits no distension.  Musculoskeletal: Normal range of motion. He exhibits tenderness.  Tenderness palpation of the right forearm, worse on the anterior side.  Tenderness palpation of the right hand.  No obvious deformities.  Increased pain with  Tinel's.  Sensation intact bilaterally.  Radial pulses intact bilaterally.  Soft compartments.  No obvious deformity.  Neurological: He is alert and oriented to person, place, and time.  Skin: Skin is warm. No rash noted.  Psychiatric: He has a normal mood and affect.  Nursing note and vitals reviewed.    ED Treatments / Results  Labs (all labs ordered are listed, but only abnormal results are displayed) Labs Reviewed - No data to display  EKG  EKG Interpretation None       Radiology No results found.  Procedures Procedures (including critical care time)  Medications Ordered in ED Medications - No data to display   Initial Impression / Assessment and Plan / ED Course  I have reviewed the triage vital signs and the nursing notes.  Pertinent labs & imaging results that were available during my care of the patient were reviewed by me and considered in my medical decision making (see chart for details).     Patient presenting for evaluation of right arm pain.  Physical exam reassuring, no neurovascular deficits.  Soft compartments.  MSK vs carpal tunnel.  Doubt fracture, I do not believe imaging is needed at this time.  Will place in splint and treat with steroids.  Discussed risks with steroid use, patient to closely monitor his blood sugars.  If they increase, patient to stop steroids.  Patient to follow-up with primary care for further evaluation of pain.  At this time, patient appears safe for discharge.  Return precautions given.  Patient states he understands and agrees to plan.   Final Clinical Impressions(s) / ED Diagnoses   Final diagnoses:  Right wrist pain    ED Discharge Orders        Ordered    diclofenac sodium (VOLTAREN) 1 % GEL  4 times daily     01/31/17 2034    predniSONE (DELTASONE) 10 MG tablet  Daily     01/31/17 2034       Franchot Heidelberg, PA-C 02/01/17 0149    Fredia Sorrow, MD 02/01/17 [phone]

## 2017-01-31 NOTE — ED Triage Notes (Signed)
Pain in his right arm from his wrist to his forearm.

## 2017-02-19 ENCOUNTER — Telehealth: Payer: Self-pay

## 2017-02-19 NOTE — Telephone Encounter (Signed)
lmvm to call us back CRM entered

## 2017-02-19 NOTE — Telephone Encounter (Signed)
I don't know where else it would be - looks like it never made it to the scan as not in the media tab.  He can get a copy of my note I guess.  On 3/10 my assessment states:  Female-to-female transgender person - Had patient sign an informed consent which will be scanned into the chart to start HRT. Reviewed risks in great detail but patient ready to proceed.

## 2017-02-19 NOTE — Telephone Encounter (Signed)
Pt states you had him sign informed consent paperwork for testosterone back in March. I do not see any info on this in media. Do you know how we can get this to the pt?  Copied from [facility]. Topic: General - Other >> Feb 19, 2017  8:53 AM Aurelio Brash B wrote: Reason for CRM: Pt is requesting a paper stating he signed informed consent  when he started testosterone injections  march of 2018

## 2017-03-28 ENCOUNTER — Encounter (HOSPITAL_BASED_OUTPATIENT_CLINIC_OR_DEPARTMENT_OTHER): Payer: Self-pay

## 2017-03-28 ENCOUNTER — Emergency Department (HOSPITAL_BASED_OUTPATIENT_CLINIC_OR_DEPARTMENT_OTHER)
Admission: EM | Admit: 2017-03-28 | Discharge: 2017-03-28 | Disposition: A | Discharge status: Home / Self Care | Payer: Medicaid Other | Attending: Emergency Medicine | Admitting: Emergency Medicine

## 2017-03-28 ENCOUNTER — Emergency Department (HOSPITAL_BASED_OUTPATIENT_CLINIC_OR_DEPARTMENT_OTHER): Payer: Medicaid Other

## 2017-03-28 ENCOUNTER — Other Ambulatory Visit: Payer: Self-pay

## 2017-03-28 DIAGNOSIS — Z794 Long term (current) use of insulin: Secondary | ICD-10-CM | POA: Diagnosis not present

## 2017-03-28 DIAGNOSIS — M25531 Pain in right wrist: Secondary | ICD-10-CM | POA: Diagnosis present

## 2017-03-28 DIAGNOSIS — I10 Essential (primary) hypertension: Secondary | ICD-10-CM | POA: Diagnosis not present

## 2017-03-28 DIAGNOSIS — E119 Type 2 diabetes mellitus without complications: Secondary | ICD-10-CM | POA: Diagnosis not present

## 2017-03-28 DIAGNOSIS — F840 Autistic disorder: Secondary | ICD-10-CM | POA: Diagnosis not present

## 2017-03-28 DIAGNOSIS — Z79899 Other long term (current) drug therapy: Secondary | ICD-10-CM | POA: Diagnosis not present

## 2017-03-28 DIAGNOSIS — M778 Other enthesopathies, not elsewhere classified: Secondary | ICD-10-CM

## 2017-03-28 MED ORDER — IBUPROFEN 800 MG PO TABS: 800.0000 mg | ORAL_TABLET | Freq: Three times a day (TID) | ORAL | 0 refills | Status: DC | PRN

## 2017-03-28 MED ORDER — IBUPROFEN 800 MG PO TABS
800.0000 mg | ORAL_TABLET | Freq: Once | ORAL | Status: AC
  Administered 2017-03-28: 800 mg via ORAL
  Filled 2017-03-28: qty 1

## 2017-03-28 NOTE — ED Triage Notes (Addendum)
Pt c/o right wrist pain for about a week, has had this pain before but denies any repetitive movement, lost his wrist brace but has been trying naproxen without relief.  Pt here with his wife.

## 2017-03-28 NOTE — Discharge Instructions (Signed)
You may alternate Tylenol 1000 mg every 6 hours as needed for pain and Ibuprofen 800 mg every 8 hours as needed for pain.  Please take Ibuprofen with food. ° °

## 2017-03-28 NOTE — ED Provider Notes (Signed)
TIME SEEN: 2:18 AM  CHIEF COMPLAINT: Right wrist pain  HPI: Patient is a 27 year old female transitioning to female with history of hypertension, diabetes, hyperlipidemia, obesity who is right-hand-dominant who presents emergency department with a week of right wrist pain.  No known injury.  Pain is worse with flexion and radial deviation.  Has been taking naproxen at home without much relief.  No numbness, tingling, redness, warmth, swelling.  ROS: See HPI Constitutional: no fever  Eyes: no drainage  ENT: no runny nose   Cardiovascular:  no chest pain  Resp: no SOB  GI: no vomiting GU: no dysuria Integumentary: no rash  Allergy: no hives  Musculoskeletal: no leg swelling  Neurological: no slurred speech ROS otherwise negative  PAST MEDICAL HISTORY/PAST SURGICAL HISTORY:  Past Medical History:  Diagnosis Date  . Autism   . Diabetes mellitus without complication ([place])   . High cholesterol   . Hypertension     MEDICATIONS:  Prior to Admission medications   Medication Sig Start Date End Date Taking? Authorizing Provider  ALPRAZolam Duanne Moron) 0.5 MG tablet Take 0.5 mg by mouth 2 (two) times a week. ON DAYS WITH THE PUBLIC 7/82/95   [provider]  amLODipine (NORVASC) 10 MG tablet Take 10 mg by mouth daily. 09/13/16   [provider]  busPIRone (BUSPAR) 10 MG tablet Take 10 mg by mouth 2 (two) times daily.    [provider]  citalopram (CELEXA) 40 MG tablet Take 40 mg by mouth every evening.     [provider]  diclofenac sodium (VOLTAREN) 1 % GEL Apply 2 g topically 4 (four) times daily. 01/31/17   Caccavale, Sophia, PA-C  Dulaglutide (TRULICITY) 1.5 AO/1.3YQ SOPN Inject 1.5 mg into the skin every Thursday.     [provider]  FLUoxetine HCl (PROZAC PO) Take by mouth.    [provider]  hydrochlorothiazide (HYDRODIURIL) 25 MG tablet Take 1 tablet (25 mg total) by mouth daily. Patient taking differently: Take 25 mg by mouth  daily after breakfast.  12/10/12   Reyne Dumas, MD  hydroxypropyl methylcellulose / hypromellose (ISOPTO TEARS / GONIOVISC) 2.5 % ophthalmic solution Place 2 drops into both eyes 3 (three) times daily as needed for dry eyes.    [provider]  ibuprofen (ADVIL,MOTRIN) 600 MG tablet Take 1 tablet (600 mg total) by mouth every 6 (six) hours as needed. Patient taking differently: Take 600 mg by mouth every 6 (six) hours as needed (for pain).  07/16/16   Montine Circle, PA-C  insulin aspart (NOVOLOG) 100 UNIT/ML injection Inject into the skin 3 (three) times daily before meals.    [provider]  insulin glargine (LANTUS) 100 UNIT/ML injection Inject 0.65 mLs (65 Units total) into the skin 2 (two) times daily. 07/07/16   Upstill, Shari, PA-C  INVOKANA 300 MG TABS tablet Take 300 mg by mouth daily. 08/03/16   [provider]  LamoTRIgine (LAMICTAL PO) Take by mouth.    [provider]  pantoprazole (PROTONIX) 20 MG tablet Take 20 mg by mouth daily after breakfast.     [provider]  prazosin (MINIPRESS) 2 MG capsule Take 2 mg by mouth at bedtime.    [provider]  testosterone cypionate (DEPO-TESTOSTERONE) 200 MG/ML injection Inject 0.25 mLs (50 mg total) into the muscle every 7 (seven) days. Patient taking differently: Inject 100 mg into the muscle every 14 (fourteen) days.  04/06/16   Shawnee Knapp, MD  UNABLE TO FIND CPAP: Nightly at  bedtime    [provider]    ALLERGIES:  Allergies  Allergen Reactions  . Penicillins Anaphylaxis    Partner handles all meds & is "deathly allergic" to this Has patient had a PCN reaction causing immediate rash, facial/tongue/throat swelling, SOB or lightheadedness with hypotension: Yes Has patient had a PCN reaction causing severe rash involving mucus membranes or skin necrosis: Unknown Has patient had a PCN reaction that required hospitalization: Yes Has patient had a PCN reaction occurring  within the last 10 years: No If all of the above answers are "NO", then may proceed with Cephalosporin   . Gabapentin   . Lactose Intolerance (Gi) Diarrhea and Nausea And Vomiting  . Metformin And Related Nausea And Vomiting  . Victoza [Liraglutide] Hives and Other (See Comments)    Also creates "issues with the liver and kidneys"    SOCIAL HISTORY:  Social History   Tobacco Use  . Smoking status: Never Smoker  . Smokeless tobacco: Never Used  Substance Use Topics  . Alcohol use: Yes    Comment: Once every 2 weeks     FAMILY HISTORY: Family History  Problem Relation Age of Onset  . Hypertension Father   . Diabetes Father   . Diabetes Sister   . Diabetes Maternal Grandmother   . Breast cancer Maternal Grandmother   . Cancer Other   . Diabetes Other   . Breast cancer Paternal Grandmother     EXAM: BP (!) 159/78 (BP Location: Left Arm)   Pulse 77   Temp 97.8 F (36.6 C) (Oral)   Resp 18   Wt (!) 141.1 kg (311 lb)   SpO2 100%   BMI 58.76 kg/m  CONSTITUTIONAL: Alert and oriented and responds appropriately to questions. Well-appearing; well-nourished HEAD: Normocephalic EYES: Conjunctivae clear, pupils appear equal, EOMI ENT: normal nose; moist mucous membranes NECK: Supple, no meningismus, no nuchal rigidity, no LAD  CARD: RRR; S1 and S2 appreciated; no murmurs, no clicks, no rubs, no gallops RESP: Normal chest excursion without splinting or tachypnea; breath sounds clear and equal bilaterally; no wheezes, no rhonchi, no rales, no hypoxia or respiratory distress, speaking full sentences ABD/GI: Normal bowel sounds; non-distended; soft, non-tender, no rebound, no guarding, no peritoneal signs, no hepatosplenomegaly BACK:  The back appears normal and is non-tender to palpation, there is no CVA tenderness EXT: Patient mildly tender palpation over the tendons in the ulnar aspect of the right hand and wrist without swelling, ecchymosis, redness or warmth.  Pain with flexion  and radial deviation of the right wrist but full range of motion in this joint.  There is no joint effusion.  2+ radial pulses bilaterally.  Normal grip strength in the right hand.  Compartments are all soft.  Normal ROM in all joints; otherwise extremities are non-tender to palpation; no edema; normal capillary refill; no cyanosis SKIN: Normal color for age and race; warm; no rash NEURO: Moves all extremities equally, normal sensation throughout the right arm PSYCH: The patient's mood and manner are appropriate. Grooming and personal hygiene are appropriate.  MEDICAL DECISION MAKING: Patient here with right wrist pain.  Suspect tendinitis.  X-ray obtained in triage and shows no acute abnormality.  Neurovascularly intact distally.  Recommended alternating Tylenol and Motrin for pain.  Will give prescription for ibuprofen.  Will place in a wrist splint for comfort and have given outpatient orthopedic follow-up if symptoms are not improving.  Patient does have a PCP.  I do not feel narcotics are indicated at this  time.  Discussed rest, elevation and ice.  No sign of gout, septic arthritis, cellulitis, abscess, arterial obstruction, DVT, compartment syndrome on exam.  At this time, I do not feel there is any life-threatening condition present. I have reviewed and discussed all results (EKG, imaging, lab, urine as appropriate) and exam findings with patient/family. I have reviewed nursing notes and appropriate previous records.  I feel the patient is safe to be discharged home without further emergent workup and can continue workup as an outpatient as needed. Discussed usual and customary return precautions. Patient/family verbalize understanding and are comfortable with this plan.  Outpatient follow-up has been provided if needed. All questions have been answered.      Ward, Delice Bison, DO 03/28/17 [phone]

## 2017-04-21 ENCOUNTER — Emergency Department (HOSPITAL_BASED_OUTPATIENT_CLINIC_OR_DEPARTMENT_OTHER)
Admission: EM | Admit: 2017-04-21 | Discharge: 2017-04-21 | Disposition: A | Discharge status: Home / Self Care | Payer: Medicaid Other | Attending: Emergency Medicine | Admitting: Emergency Medicine

## 2017-04-21 ENCOUNTER — Encounter (HOSPITAL_BASED_OUTPATIENT_CLINIC_OR_DEPARTMENT_OTHER): Payer: Self-pay | Admitting: Emergency Medicine

## 2017-04-21 ENCOUNTER — Other Ambulatory Visit: Payer: Self-pay

## 2017-04-21 ENCOUNTER — Emergency Department (HOSPITAL_BASED_OUTPATIENT_CLINIC_OR_DEPARTMENT_OTHER): Payer: Medicaid Other

## 2017-04-21 DIAGNOSIS — Z794 Long term (current) use of insulin: Secondary | ICD-10-CM | POA: Diagnosis not present

## 2017-04-21 DIAGNOSIS — I10 Essential (primary) hypertension: Secondary | ICD-10-CM | POA: Insufficient documentation

## 2017-04-21 DIAGNOSIS — M545 Low back pain: Secondary | ICD-10-CM | POA: Diagnosis present

## 2017-04-21 DIAGNOSIS — Z79899 Other long term (current) drug therapy: Secondary | ICD-10-CM | POA: Diagnosis not present

## 2017-04-21 DIAGNOSIS — M5441 Lumbago with sciatica, right side: Secondary | ICD-10-CM | POA: Diagnosis not present

## 2017-04-21 DIAGNOSIS — E119 Type 2 diabetes mellitus without complications: Secondary | ICD-10-CM | POA: Diagnosis not present

## 2017-04-21 DIAGNOSIS — F840 Autistic disorder: Secondary | ICD-10-CM | POA: Diagnosis not present

## 2017-04-21 LAB — CBG MONITORING, ED: Glucose-Capillary: 73 mg/dL (ref 65–99)

## 2017-04-21 MED ORDER — TRAMADOL HCL 50 MG PO TABS: 50.0000 mg | ORAL_TABLET | Freq: Four times a day (QID) | ORAL | 0 refills | Status: DC | PRN

## 2017-04-21 MED ORDER — CYCLOBENZAPRINE HCL 10 MG PO TABS
10.0000 mg | ORAL_TABLET | Freq: Once | ORAL | Status: AC
  Administered 2017-04-21: 10 mg via ORAL
  Filled 2017-04-21: qty 1

## 2017-04-21 MED ORDER — TRAMADOL HCL 50 MG PO TABS
50.0000 mg | ORAL_TABLET | Freq: Once | ORAL | Status: AC
  Administered 2017-04-21: 50 mg via ORAL
  Filled 2017-04-21: qty 1

## 2017-04-21 MED ORDER — CYCLOBENZAPRINE HCL 10 MG PO TABS: 10.0000 mg | ORAL_TABLET | Freq: Two times a day (BID) | ORAL | 0 refills | Status: DC | PRN

## 2017-04-21 NOTE — ED Notes (Signed)
Patient transported to CT 

## 2017-04-21 NOTE — ED Provider Notes (Signed)
[facility] DEPART[facility] Provider Note   CSN: [id number] Arrival date & time: 04/21/17  0253     History   Chief Complaint Chief Complaint  Patient presents with  . Back Pain    HPI Krista Williams is a 27 y.o. adult.  The history is provided by the patient, the spouse and medical records. No language interpreter was used.  Back Pain   This is a recurrent problem. The current episode started 2 days ago. The problem occurs constantly. The problem has not changed since onset.The pain is associated with falling. The pain is present in the lumbar spine. The quality of the pain is described as stabbing and shooting. The pain radiates to the right thigh. The pain is severe. The symptoms are aggravated by bending. Pertinent negatives include no chest pain, no fever, no headaches, no abdominal pain, no bowel incontinence, no perianal numbness, no bladder incontinence, no dysuria, no pelvic pain, no leg pain, no paresthesias, no paresis, no tingling and no weakness. He has tried NSAIDs for the symptoms. The treatment provided no relief. Risk factors include obesity.    Past Medical History:  Diagnosis Date  . Autism   . Diabetes mellitus without complication ([place])   . High cholesterol   . Hypertension     Patient Active Problem List   Diagnosis Date Noted  . Insulin dependent type 2 diabetes mellitus, uncontrolled ([place]) 04/06/2016  . High cholesterol 04/06/2016  . Hypertension 04/06/2016  . Endocrine disorder 04/06/2016  . Encounter for long-term current use of high risk medication 04/06/2016  . Obesity due to excess calories with serious comorbidity 04/06/2016  . Other mixed anxiety disorders 04/06/2016  . Depression 04/06/2016  . Female-to-female transgender person 04/06/2016  . PCOS (polycystic ovarian syndrome) 04/06/2016  . Vitamin D deficiency 04/06/2016    History reviewed. No pertinent surgical history.   OB History   None      Home Medications      Prior to Admission medications   Medication Sig Start Date End Date Taking? Authorizing Provider  citalopram (CELEXA) 40 MG tablet Take 40 mg by mouth every evening.    Yes [provider]  cycloSPORINE (RESTASIS) 0.05 % ophthalmic emulsion 1 drop 2 (two) times daily.   Yes [provider]  Dulaglutide (TRULICITY) 1.5 VH/8.4ON SOPN Inject 1.5 mg into the skin every Thursday.    Yes [provider]  Exenatide (BYDUREON [place]) Inject 2 mg into the skin.   Yes [provider]  ALPRAZolam Duanne Moron) 0.5 MG tablet Take 0.5 mg by mouth 2 (two) times a week. ON DAYS WITH THE PUBLIC 07/21/50   [provider]  amLODipine (NORVASC) 10 MG tablet Take 10 mg by mouth daily. 09/13/16   [provider]  busPIRone (BUSPAR) 10 MG tablet Take 10 mg by mouth 2 (two) times daily.    [provider]  diclofenac sodium (VOLTAREN) 1 % GEL Apply 2 g topically 4 (four) times daily. 01/31/17   Caccavale, Sophia, PA-C  FLUoxetine HCl (PROZAC PO) Take by mouth.    [provider]  hydrochlorothiazide (HYDRODIURIL) 25 MG tablet Take 1 tablet (25 mg total) by mouth daily. Patient taking differently: Take 25 mg by mouth daily after breakfast.  12/10/12   Reyne Dumas, MD  hydroxypropyl methylcellulose / hypromellose (ISOPTO TEARS / GONIOVISC) 2.5 % ophthalmic solution Place 2 drops into both eyes 3 (three) times daily as needed for dry eyes.    [provider]  ibuprofen (ADVIL,MOTRIN)  800 MG tablet Take 1 tablet (800 mg total) by mouth every 8 (eight) hours as needed for mild pain. 03/28/17   Ward, Delice Bison, DO  insulin aspart (NOVOLOG) 100 UNIT/ML injection Inject into the skin 3 (three) times daily before meals.    [provider]  insulin glargine (LANTUS) 100 UNIT/ML injection Inject 0.65 mLs (65 Units total) into the skin 2 (two) times daily. 07/07/16   Upstill, Shari, PA-C  INVOKANA 300 MG TABS tablet Take 300 mg by mouth daily. 08/03/16    [provider]  LamoTRIgine (LAMICTAL PO) Take by mouth.    [provider]  pantoprazole (PROTONIX) 20 MG tablet Take 20 mg by mouth daily after breakfast.     [provider]  prazosin (MINIPRESS) 2 MG capsule Take 2 mg by mouth at bedtime.    [provider]  testosterone cypionate (DEPO-TESTOSTERONE) 200 MG/ML injection Inject 0.25 mLs (50 mg total) into the muscle every 7 (seven) days. Patient taking differently: Inject 100 mg into the muscle every 14 (fourteen) days.  04/06/16   Shawnee Knapp, MD  UNABLE TO FIND CPAP: Nightly at bedtime    [provider]    Family History Family History  Problem Relation Age of Onset  . Hypertension Father   . Diabetes Father   . Diabetes Sister   . Diabetes Maternal Grandmother   . Breast cancer Maternal Grandmother   . Cancer Other   . Diabetes Other   . Breast cancer Paternal Grandmother     Social History Social History   Tobacco Use  . Smoking status: Never Smoker  . Smokeless tobacco: Never Used  Substance Use Topics  . Alcohol use: Never    Frequency: Never  . Drug use: No     Allergies   Penicillins; Gabapentin; Lactose intolerance (gi); Metformin and related; and Victoza [liraglutide]   Review of Systems Review of Systems  Constitutional: Negative for chills, diaphoresis, fatigue and fever.  HENT: Negative for congestion.   Respiratory: Negative for cough, chest tightness, shortness of breath and wheezing.   Cardiovascular: Negative for chest pain.  Gastrointestinal: Negative for abdominal pain, bowel incontinence, constipation, diarrhea, nausea and vomiting.  Genitourinary: Negative for bladder incontinence, dysuria, flank pain and pelvic pain.  Musculoskeletal: Positive for back pain. Negative for neck pain and neck stiffness.  Skin: Negative for rash and wound.  Neurological: Negative for tingling, weakness, light-headedness, headaches and paresthesias.    Psychiatric/Behavioral: Negative for agitation.  All other systems reviewed and are negative.    Physical Exam Updated Vital Signs BP 127/76 (BP Location: Right Arm)   Pulse 71   Temp 98.1 F (36.7 C) (Oral)   Resp 18   Ht 5\' 1"  (1.549 m)   Wt 131.1 kg (289 lb)   SpO2 98%   BMI 54.61 kg/m   Physical Exam  Constitutional: He is oriented to person, place, and time. He appears well-developed and well-nourished. No distress.  HENT:  Head: Normocephalic.  Nose: Nose normal.  Mouth/Throat: Oropharynx is clear and moist. No oropharyngeal exudate.  Eyes: Pupils are equal, round, and reactive to light.  Neck: Neck supple.  Cardiovascular: Normal rate.  No murmur heard. Pulmonary/Chest: No respiratory distress. He has no wheezes. He exhibits no tenderness.  Abdominal: Soft. He exhibits no distension. There is no tenderness.  Musculoskeletal: He exhibits tenderness. He exhibits no edema.       Lumbar back: He exhibits tenderness, pain and spasm.  Back:  Neurological: He is alert and oriented to person, place, and time. No cranial nerve deficit or sensory deficit. He exhibits normal muscle tone. Coordination normal.  Neurologic exam is unremarkable.  Normal sensation and strength in bilateral lower extremity's.  Normal pulses in lower extremities.  Patient has tenderness in his right low back and mid back.  No other abnormalities on exam.  Skin: Capillary refill takes less than 2 seconds. He is not diaphoretic. No pallor.  Psychiatric: He has a normal mood and affect.  Nursing note and vitals reviewed.    ED Treatments / Results  Labs (all labs ordered are listed, but only abnormal results are displayed) Labs Reviewed  CBG MONITORING, ED    EKG None  Radiology Ct Lumbar Spine Wo Contrast  Result Date: 04/21/2017 CLINICAL DATA:  Radiculopathy.  Minor trauma. EXAM: CT LUMBAR SPINE WITHOUT CONTRAST TECHNIQUE: Multidetector CT imaging of the lumbar spine was performed  without intravenous contrast administration. Multiplanar CT image reconstructions were also generated. COMPARISON:  Abdominal CT 10/23/2015 FINDINGS: Segmentation: 5 lumbar type vertebral bodies. Alignment: Normal Vertebrae: Negative for fracture, endplate erosion, or focal bone lesion. Paraspinal and other soft tissues: Negative. No explanation for pain. Disc levels: No visible herniation or impingement. Minor upper lumbar endplate spurring. IMPRESSION: No acute finding or visible impingement Electronically Signed   By: Monte Fantasia M.D.   On: 04/21/2017 09:09    Procedures Procedures (including critical care time)  Medications Ordered in ED Medications  cyclobenzaprine (FLEXERIL) tablet 10 mg (10 mg Oral Given 04/21/17 0839)  traMADol (ULTRAM) tablet 50 mg (50 mg Oral Given 04/21/17 0839)     Initial Impression / Assessment and Plan / ED Course  I have reviewed the triage vital signs and the nursing notes.  Pertinent labs & imaging results that were available during my care of the patient were reviewed by me and considered in my medical decision making (see chart for details).     Janece Laidlaw is a 27 y.o. adult with a past medical history significant for hypertension, hyper cholesterolemia, female to female transgender, PCO S, and chronic degenerative disc disease in the back with chronic back pain who presents with 2 falls and worsened low back pain.  Patient reports that he had a fall 3 days ago where he tripped on a sandal flip flop causing him to fall to his knees and having onset of low back pain.  He reports the pain radiates down his right leg towards his knee.  He also reports that yesterday he tripped getting out of a truck landing on his back.  He denies any urinary or bowel incontinence.  He denies any numbness, tingling, or weakness of legs.  He denies any head injury, nausea vomiting, vision changes.  He denies any systemic signs of infection.  He denies any confusion,  diarrhea, or urinary symptoms.  He describes his pain as moderate to severe in the mid and right low back.  On exam, patient had normal sensation and strength in lower extremity's.  Patient had palpable tenderness in the right and mid low back with muscle spasm present.  Abdomen otherwise unremarkable and lungs clear.  Exam otherwise unremarkable.  Due to patient's report of chronic degenerative disc disease in his back and the new trauma with symptoms, patient will have CT to further evaluate.  No red flags for cord compression or significant injury.  Patient given a dose of the Ultram which she reports has helped significantly in the past for  back pain and a Flexeril which she also has had help before.    Patient symptoms greatly improved after medications and CT reassuring.    Patient will follow up with PCP and be discharged.  Patient understood return precautions for any new or worsened symptoms and patient was discharged in good condition.      Final Clinical Impressions(s) / ED Diagnoses   Final diagnoses:  Acute right-sided low back pain with right-sided sciatica    ED Discharge Orders        Ordered    cyclobenzaprine (FLEXERIL) 10 MG tablet  2 times daily PRN     04/21/17 1117    traMADol (ULTRAM) 50 MG tablet  Every 6 hours PRN     04/21/17 1117     Clinical Impression: 1. Acute right-sided low back pain with right-sided sciatica     Disposition: Discharge  Condition: Good  I have discussed the results, Dx and Tx plan with the pt(& family if present). He/she/they expressed understanding and agree(s) with the plan. Discharge instructions discussed at great length. Strict return precautions discussed and pt &/or family have verbalized understanding of the instructions. No further questions at time of discharge.    New Prescriptions   CYCLOBENZAPRINE (FLEXERIL) 10 MG TABLET    Take 1 tablet (10 mg total) by mouth 2 (two) times daily as needed for muscle spasms.    TRAMADOL (ULTRAM) 50 MG TABLET    Take 1 tablet (50 mg total) by mouth every 6 (six) hours as needed.    Follow Up: Penni Bombard, [place] [address] [place] [address] [phone]     [facility] [place] [facility] [address] [id number] [place] [facility] [place] [phone]       Adilene Areola, Gwenyth Allegra, MD 04/21/17 [phone]

## 2017-04-21 NOTE — ED Triage Notes (Signed)
Pt reports multiple falls "due to neuropathy". States back pain since fall to knees two days ago. Also reports numbness from mid back to upper posterior thigh since fall also.

## 2017-04-21 NOTE — Discharge Instructions (Addendum)
Your workup today did not show new injury from your falls. WE suspect you irritated your chonic back problems making you have the sciatic pain going into your R leg. Please use the pain meds and muscle relaxant to help. Please follow up with your PCP and return if symptoms worsen.

## 2017-05-24 ENCOUNTER — Encounter (HOSPITAL_BASED_OUTPATIENT_CLINIC_OR_DEPARTMENT_OTHER): Payer: Self-pay | Admitting: Adult Health

## 2017-05-24 ENCOUNTER — Other Ambulatory Visit: Payer: Self-pay

## 2017-05-24 DIAGNOSIS — I10 Essential (primary) hypertension: Secondary | ICD-10-CM | POA: Insufficient documentation

## 2017-05-24 DIAGNOSIS — Z79899 Other long term (current) drug therapy: Secondary | ICD-10-CM | POA: Diagnosis not present

## 2017-05-24 DIAGNOSIS — F840 Autistic disorder: Secondary | ICD-10-CM | POA: Diagnosis not present

## 2017-05-24 DIAGNOSIS — N390 Urinary tract infection, site not specified: Secondary | ICD-10-CM | POA: Diagnosis not present

## 2017-05-24 DIAGNOSIS — Z794 Long term (current) use of insulin: Secondary | ICD-10-CM | POA: Insufficient documentation

## 2017-05-24 DIAGNOSIS — E119 Type 2 diabetes mellitus without complications: Secondary | ICD-10-CM | POA: Insufficient documentation

## 2017-05-24 DIAGNOSIS — R5383 Other fatigue: Secondary | ICD-10-CM | POA: Diagnosis present

## 2017-05-24 LAB — CBG MONITORING, ED: GLUCOSE-CAPILLARY: 227 mg/dL — AB (ref 65–99)

## 2017-05-24 NOTE — ED Triage Notes (Signed)
PResents with not feeling well, increased urination and polydipsia and polyphagia. He reports that he feelsdehydrated and just not feeling great. CBG 227

## 2017-05-25 ENCOUNTER — Other Ambulatory Visit: Payer: Self-pay

## 2017-05-25 ENCOUNTER — Emergency Department (HOSPITAL_BASED_OUTPATIENT_CLINIC_OR_DEPARTMENT_OTHER)
Admission: EM | Admit: 2017-05-25 | Discharge: 2017-05-25 | Disposition: A | Discharge status: Home / Self Care | Payer: Medicaid Other | Attending: Emergency Medicine | Admitting: Emergency Medicine

## 2017-05-25 DIAGNOSIS — N390 Urinary tract infection, site not specified: Secondary | ICD-10-CM

## 2017-05-25 LAB — URINALYSIS, MICROSCOPIC (REFLEX)

## 2017-05-25 LAB — URINALYSIS, ROUTINE W REFLEX MICROSCOPIC
Bilirubin Urine: NEGATIVE
Glucose, UA: >=500 mg/dL — AB
KETONES UR: NEGATIVE mg/dL
NITRITE: NEGATIVE
PROTEIN: NEGATIVE mg/dL
Specific Gravity, Urine: 1.02 (ref 1.005–1.030)
pH: 6.5 (ref 5.0–8.0)

## 2017-05-25 MED ORDER — NITROFURANTOIN MONOHYD MACRO 100 MG PO CAPS: 100.0000 mg | ORAL_CAPSULE | Freq: Two times a day (BID) | ORAL | 0 refills | Status: DC

## 2017-05-25 MED ORDER — NITROFURANTOIN MONOHYD MACRO 100 MG PO CAPS
100.0000 mg | ORAL_CAPSULE | Freq: Once | ORAL | Status: AC
  Administered 2017-05-25: 100 mg via ORAL
  Filled 2017-05-25: qty 1

## 2017-05-25 MED ORDER — SODIUM CHLORIDE 0.9 % IV BOLUS
1000.0000 mL | Freq: Once | INTRAVENOUS | Status: AC
  Administered 2017-05-25: 1000 mL via INTRAVENOUS

## 2017-05-25 NOTE — ED Provider Notes (Addendum)
[facility] Provider Note: Krista Spurling, MD, FACEP  CSN: [id number] MRN: [id number] ARRIVAL: 05/24/17 at [place]: [place]  Fatigue   HISTORY OF PRESENT ILLNESS  05/25/17 3:52 AM Krista Williams is a 27 y.o. adult who is a female to female transgender.  The patient is here with 3 days of general fatigue, increased urine output and increased thirst.  There has been no dysuria but dysuria has not usually been associated with the patient's previous urinary tract infections.  The patient feels dehydrated and was noted to have a sugar of 227 on arrival.   Past Medical History:  Diagnosis Date  . Autism   . Diabetes mellitus without complication ([place])   . High cholesterol   . Hypertension     History reviewed. No pertinent surgical history.  Family History  Problem Relation Age of Onset  . Hypertension Father   . Diabetes Father   . Diabetes Sister   . Diabetes Maternal Grandmother   . Breast cancer Maternal Grandmother   . Cancer Other   . Diabetes Other   . Breast cancer Paternal Grandmother     Social History   Tobacco Use  . Smoking status: Never Smoker  . Smokeless tobacco: Never Used  Substance Use Topics  . Alcohol use: Never    Frequency: Never  . Drug use: No    Prior to Admission medications   Medication Sig Start Date End Date Taking? Authorizing Provider  ALPRAZolam Duanne Moron) 0.5 MG tablet Take 0.5 mg by mouth 2 (two) times a week. ON DAYS WITH THE PUBLIC 8/92/11   [provider]  amLODipine (NORVASC) 10 MG tablet Take 10 mg by mouth daily. 09/13/16   [provider]  busPIRone (BUSPAR) 10 MG tablet Take 10 mg by mouth 2 (two) times daily.    [provider]  citalopram (CELEXA) 40 MG tablet Take 40 mg by mouth every evening.     [provider]  cyclobenzaprine (FLEXERIL) 10 MG tablet Take 1 tablet (10 mg total) by mouth 2 (two) times daily as needed for muscle spasms. 04/21/17   Tegeler,  Gwenyth Allegra, MD  cycloSPORINE (RESTASIS) 0.05 % ophthalmic emulsion 1 drop 2 (two) times daily.    [provider]  diclofenac sodium (VOLTAREN) 1 % GEL Apply 2 g topically 4 (four) times daily. 01/31/17   Caccavale, Sophia, PA-C  Dulaglutide (TRULICITY) 1.5 HE/1.7EY SOPN Inject 1.5 mg into the skin every Thursday.     [provider]  Exenatide (BYDUREON [place]) Inject 2 mg into the skin.    [provider]  FLUoxetine HCl (PROZAC PO) Take by mouth.    [provider]  hydrochlorothiazide (HYDRODIURIL) 25 MG tablet Take 1 tablet (25 mg total) by mouth daily. Patient taking differently: Take 25 mg by mouth daily after breakfast.  12/10/12   Reyne Dumas, MD  hydroxypropyl methylcellulose / hypromellose (ISOPTO TEARS / GONIOVISC) 2.5 % ophthalmic solution Place 2 drops into both eyes 3 (three) times daily as needed for dry eyes.    [provider]  ibuprofen (ADVIL,MOTRIN) 800 MG tablet Take 1 tablet (800 mg total) by mouth every 8 (eight) hours as needed for mild pain. 03/28/17   Ward, Delice Bison, DO  insulin aspart (NOVOLOG) 100 UNIT/ML injection Inject into the skin 3 (three) times daily before meals.    [provider]  insulin glargine (LANTUS) 100 UNIT/ML injection Inject 0.65 mLs (65 Units total) into the skin 2 (  two) times daily. 07/07/16   Upstill, Shari, PA-C  INVOKANA 300 MG TABS tablet Take 300 mg by mouth daily. 08/03/16   [provider]  LamoTRIgine (LAMICTAL PO) Take by mouth.    [provider]  pantoprazole (PROTONIX) 20 MG tablet Take 20 mg by mouth daily after breakfast.     [provider]  prazosin (MINIPRESS) 2 MG capsule Take 2 mg by mouth at bedtime.    [provider]  testosterone cypionate (DEPO-TESTOSTERONE) 200 MG/ML injection Inject 0.25 mLs (50 mg total) into the muscle every 7 (seven) days. Patient taking differently: Inject 100 mg into the muscle every 14 (fourteen) days.  04/06/16    Shawnee Knapp, MD  traMADol (ULTRAM) 50 MG tablet Take 1 tablet (50 mg total) by mouth every 6 (six) hours as needed. 04/21/17   Tegeler, Gwenyth Allegra, MD  UNABLE TO FIND CPAP: Nightly at bedtime    [provider]    Allergies Penicillins; Gabapentin; Lactose intolerance (gi); Metformin and related; and Victoza [liraglutide]   REVIEW OF SYSTEMS  Negative except as noted here or in the History of Present Illness.   PHYSICAL EXAMINATION  Initial Vital Signs Blood pressure 135/74, pulse 83, temperature 98.5 F (36.9 C), temperature source Oral, resp. rate 18, height 5\' 1"  (1.549 m), weight 131.1 kg (289 lb), SpO2 99 %.  Examination General: Well-developed, obese adult in no acute distress; appearance consistent with age of record HENT: normocephalic; atraumatic Eyes: pupils equal, round and reactive to light; extraocular muscles intact Neck: supple Heart: regular rate and rhythm Lungs: clear to auscultation bilaterally Abdomen: soft; nondistended; mild suprapubic tenderness; bowel sounds present Extremities: No deformity; full range of motion; pulses normal Neurologic: Awake, alert and oriented; motor function intact in all extremities and symmetric; no facial droop Skin: Warm and dry; female body hair pattern Psychiatric: Normal mood and affect   RESULTS  Summary of this visit's results, reviewed by myself:   EKG Interpretation  Date/Time:    Ventricular Rate:    PR Interval:    QRS Duration:   QT Interval:    QTC Calculation:   R Axis:     Text Interpretation:        Laboratory Studies: Results for orders placed or performed during the hospital encounter of 05/25/17 (from the past 24 hour(s))  CBG monitoring, ED     Status: Abnormal   Collection Time: 05/24/17 11:40 PM  Result Value Ref Range   Glucose-Capillary 227 (H) 65 - 99 mg/dL  Urinalysis, Routine w reflex microscopic     Status: Abnormal   Collection Time: 05/24/17 11:53 PM  Result Value Ref Range     Color, Urine YELLOW YELLOW   APPearance CLOUDY (A) CLEAR   Specific Gravity, Urine 1.020 1.005 - 1.030   pH 6.5 5.0 - 8.0   Glucose, UA >=500 (A) NEGATIVE mg/dL   Hgb urine dipstick TRACE (A) NEGATIVE   Bilirubin Urine NEGATIVE NEGATIVE   Ketones, ur NEGATIVE NEGATIVE mg/dL   Protein, ur NEGATIVE NEGATIVE mg/dL   Nitrite NEGATIVE NEGATIVE   Leukocytes, UA MODERATE (A) NEGATIVE  Urinalysis, Microscopic (reflex)     Status: Abnormal   Collection Time: 05/24/17 11:53 PM  Result Value Ref Range   RBC / HPF 0-5 0 - 5 RBC/hpf   WBC, UA >50 0 - 5 WBC/hpf   Bacteria, UA MANY (A) NONE SEEN   Squamous Epithelial / LPF 0-5 0 - 5   WBC Clumps PRESENT  Imaging Studies: No results found.  ED COURSE and MDM  Nursing notes and initial vitals signs, including pulse oximetry, reviewed.  Vitals:   05/24/17 2341 05/25/17 0214 05/25/17 0225  BP: (!) 144/75  135/74  Pulse: 91  83  Resp: 18  18  Temp: 98.5 F (36.9 C)    TempSrc: Oral    SpO2: 99%  99%  Weight:  131.1 kg (289 lb)   Height:  5\' 1"  (1.549 m)    Urinalysis consistent with a urinary tract infection.  Urine is been sent for culture.  Patient feeling better after IV fluid bolus.  PROCEDURES    ED DIAGNOSES     ICD-10-CM   1. Lower urinary tract infectious disease N39.0        Lyrik Dockstader, MD 05/25/17 0400    Emelia Sandoval, Jenny Reichmann, MD 05/25/17 1779

## 2017-05-26 LAB — URINE CULTURE

## 2017-08-10 ENCOUNTER — Emergency Department (HOSPITAL_BASED_OUTPATIENT_CLINIC_OR_DEPARTMENT_OTHER)
Admission: EM | Admit: 2017-08-10 | Discharge: 2017-08-10 | Disposition: A | Discharge status: Home / Self Care | Payer: Medicaid Other | Attending: Emergency Medicine | Admitting: Emergency Medicine

## 2017-08-10 ENCOUNTER — Emergency Department (HOSPITAL_BASED_OUTPATIENT_CLINIC_OR_DEPARTMENT_OTHER): Payer: Medicaid Other

## 2017-08-10 ENCOUNTER — Encounter (HOSPITAL_BASED_OUTPATIENT_CLINIC_OR_DEPARTMENT_OTHER): Payer: Self-pay | Admitting: *Deleted

## 2017-08-10 ENCOUNTER — Other Ambulatory Visit: Payer: Self-pay

## 2017-08-10 DIAGNOSIS — R1084 Generalized abdominal pain: Secondary | ICD-10-CM

## 2017-08-10 DIAGNOSIS — Y939 Activity, unspecified: Secondary | ICD-10-CM | POA: Diagnosis not present

## 2017-08-10 DIAGNOSIS — Z794 Long term (current) use of insulin: Secondary | ICD-10-CM | POA: Diagnosis not present

## 2017-08-10 DIAGNOSIS — M79672 Pain in left foot: Secondary | ICD-10-CM | POA: Insufficient documentation

## 2017-08-10 DIAGNOSIS — S6992XA Unspecified injury of left wrist, hand and finger(s), initial encounter: Secondary | ICD-10-CM

## 2017-08-10 DIAGNOSIS — M25512 Pain in left shoulder: Secondary | ICD-10-CM | POA: Diagnosis present

## 2017-08-10 DIAGNOSIS — Z79899 Other long term (current) drug therapy: Secondary | ICD-10-CM | POA: Insufficient documentation

## 2017-08-10 DIAGNOSIS — Z87891 Personal history of nicotine dependence: Secondary | ICD-10-CM | POA: Diagnosis not present

## 2017-08-10 DIAGNOSIS — R1033 Periumbilical pain: Secondary | ICD-10-CM | POA: Diagnosis not present

## 2017-08-10 DIAGNOSIS — Y999 Unspecified external cause status: Secondary | ICD-10-CM | POA: Insufficient documentation

## 2017-08-10 DIAGNOSIS — Y929 Unspecified place or not applicable: Secondary | ICD-10-CM | POA: Insufficient documentation

## 2017-08-10 DIAGNOSIS — E119 Type 2 diabetes mellitus without complications: Secondary | ICD-10-CM | POA: Insufficient documentation

## 2017-08-10 DIAGNOSIS — I10 Essential (primary) hypertension: Secondary | ICD-10-CM | POA: Insufficient documentation

## 2017-08-10 LAB — CBC WITH DIFFERENTIAL/PLATELET
BASOS ABS: 0.1 10*3/uL (ref 0.0–0.1)
Basophils Relative: 0 %
Eosinophils Absolute: 0.2 10*3/uL (ref 0.0–0.7)
Eosinophils Relative: 1 %
HCT: 49.7 % — ABNORMAL HIGH (ref 36.0–46.0)
Hemoglobin: 17.1 g/dL — ABNORMAL HIGH (ref 12.0–15.0)
LYMPHS ABS: 2.7 10*3/uL (ref 0.7–4.0)
LYMPHS PCT: 20 %
MCH: 29.4 pg (ref 26.0–34.0)
MCHC: 34.4 g/dL (ref 30.0–36.0)
MCV: 85.4 fL (ref 78.0–100.0)
MONO ABS: 0.7 10*3/uL (ref 0.1–1.0)
Monocytes Relative: 5 %
NEUTROS ABS: 10.3 10*3/uL — AB (ref 1.7–7.7)
Neutrophils Relative %: 74 %
Platelets: 326 10*3/uL (ref 150–400)
RBC: 5.82 MIL/uL — AB (ref 3.87–5.11)
RDW: 13.9 % (ref 11.5–15.5)
WBC: 14 10*3/uL — AB (ref 4.0–10.5)

## 2017-08-10 LAB — COMPREHENSIVE METABOLIC PANEL
ALT: 44 U/L (ref 0–44)
AST: 24 U/L (ref 15–41)
Albumin: 4.2 g/dL (ref 3.5–5.0)
Alkaline Phosphatase: 64 U/L (ref 38–126)
Anion gap: 11 (ref 5–15)
BUN: 16 mg/dL (ref 6–20)
CO2: 27 mmol/L (ref 22–32)
CREATININE: 0.66 mg/dL (ref 0.44–1.00)
Calcium: 9.2 mg/dL (ref 8.9–10.3)
Chloride: 99 mmol/L (ref 98–111)
Glucose, Bld: 179 mg/dL — ABNORMAL HIGH (ref 70–99)
Potassium: 3.1 mmol/L — ABNORMAL LOW (ref 3.5–5.1)
Sodium: 137 mmol/L (ref 135–145)
TOTAL PROTEIN: 8 g/dL (ref 6.5–8.1)
Total Bilirubin: 0.3 mg/dL (ref 0.3–1.2)

## 2017-08-10 LAB — LIPASE, BLOOD: LIPASE: 34 U/L (ref 11–51)

## 2017-08-10 MED ORDER — POTASSIUM CHLORIDE CRYS ER 20 MEQ PO TBCR
40.0000 meq | EXTENDED_RELEASE_TABLET | Freq: Once | ORAL | Status: AC
  Administered 2017-08-10: 40 meq via ORAL
  Filled 2017-08-10: qty 2

## 2017-08-10 MED ORDER — POTASSIUM CHLORIDE ER 10 MEQ PO TBCR: 10.0000 meq | EXTENDED_RELEASE_TABLET | Freq: Every day | ORAL | 0 refills | Status: AC

## 2017-08-10 MED ORDER — IOPAMIDOL (ISOVUE-300) INJECTION 61%
100.0000 mL | Freq: Once | INTRAVENOUS | Status: AC | PRN
  Administered 2017-08-10: 100 mL via INTRAVENOUS
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
## 2017-08-10 NOTE — ED Notes (Signed)
CT will need to wait for bun/creat/egfr to result prior to imaging with IV contrast per [facility] protocol, pt with hx DM 

## 2017-08-10 NOTE — Discharge Instructions (Signed)
Your images and lab testing were reassuring.  No evidence of traumatic injury seen.  We did find the low potassium which we gave you oral potassium supplementation for in the emergency department.  Please take potassium for the next several days to help continue repletion.  Please follow-up with your primary doctor and your surgical team as directed.  If any symptoms change or worsen, please return to the nearest emergency department.

## 2017-08-23 ENCOUNTER — Encounter (HOSPITAL_BASED_OUTPATIENT_CLINIC_OR_DEPARTMENT_OTHER): Payer: Self-pay | Admitting: Emergency Medicine

## 2017-08-23 ENCOUNTER — Other Ambulatory Visit: Payer: Self-pay

## 2017-08-23 ENCOUNTER — Emergency Department (HOSPITAL_BASED_OUTPATIENT_CLINIC_OR_DEPARTMENT_OTHER)
Admission: EM | Admit: 2017-08-23 | Discharge: 2017-08-23 | Disposition: A | Discharge status: Home / Self Care | Payer: Medicaid Other | Attending: Emergency Medicine | Admitting: Emergency Medicine

## 2017-08-23 ENCOUNTER — Emergency Department (HOSPITAL_BASED_OUTPATIENT_CLINIC_OR_DEPARTMENT_OTHER): Payer: Medicaid Other

## 2017-08-23 DIAGNOSIS — Y939 Activity, unspecified: Secondary | ICD-10-CM | POA: Insufficient documentation

## 2017-08-23 DIAGNOSIS — E119 Type 2 diabetes mellitus without complications: Secondary | ICD-10-CM | POA: Diagnosis not present

## 2017-08-23 DIAGNOSIS — E7800 Pure hypercholesterolemia, unspecified: Secondary | ICD-10-CM | POA: Diagnosis not present

## 2017-08-23 DIAGNOSIS — Z794 Long term (current) use of insulin: Secondary | ICD-10-CM | POA: Insufficient documentation

## 2017-08-23 DIAGNOSIS — Z87891 Personal history of nicotine dependence: Secondary | ICD-10-CM | POA: Insufficient documentation

## 2017-08-23 DIAGNOSIS — Y929 Unspecified place or not applicable: Secondary | ICD-10-CM | POA: Diagnosis not present

## 2017-08-23 DIAGNOSIS — I10 Essential (primary) hypertension: Secondary | ICD-10-CM | POA: Diagnosis not present

## 2017-08-23 DIAGNOSIS — S63501A Unspecified sprain of right wrist, initial encounter: Secondary | ICD-10-CM | POA: Diagnosis not present

## 2017-08-23 DIAGNOSIS — S6991XA Unspecified injury of right wrist, hand and finger(s), initial encounter: Secondary | ICD-10-CM | POA: Diagnosis present

## 2017-08-23 DIAGNOSIS — W010XXA Fall on same level from slipping, tripping and stumbling without subsequent striking against object, initial encounter: Secondary | ICD-10-CM | POA: Diagnosis not present

## 2017-08-23 DIAGNOSIS — Y998 Other external cause status: Secondary | ICD-10-CM | POA: Diagnosis not present

## 2017-08-23 DIAGNOSIS — S93401A Sprain of unspecified ligament of right ankle, initial encounter: Secondary | ICD-10-CM | POA: Diagnosis not present

## 2017-08-23 DIAGNOSIS — Z79899 Other long term (current) drug therapy: Secondary | ICD-10-CM | POA: Diagnosis not present

## 2017-08-23 MED ORDER — KETOROLAC TROMETHAMINE 60 MG/2ML IM SOLN
30.0000 mg | Freq: Once | INTRAMUSCULAR | Status: DC
  Filled 2017-08-23: qty 2

## 2017-08-23 MED ORDER — OXYCODONE-ACETAMINOPHEN 5-325 MG PO TABS
2.0000 | ORAL_TABLET | Freq: Once | ORAL | Status: AC
  Administered 2017-08-23: 2 via ORAL
  Filled 2017-08-23: qty 2

## 2017-08-23 NOTE — ED Provider Notes (Signed)
Emergency Department Provider Note   I have reviewed the triage vital signs and the nursing notes.   HISTORY  Chief Complaint Leg Injury   HPI Krista Williams is a 27 y.o. adult presents the emergency department today secondary to a fall yesterday.  Has persistent right wrist, knee and ankle pain.  Causing limp and difficulty walking and using the hand.  Has broken that wrist before.  Came here for further evaluation.  No other injuries.  No syncope.  Fell on wet floor no preceding chest pain, shortness of breath. No other associated or modifying symptoms.    Past Medical History:  Diagnosis Date  . Autism   . Diabetes mellitus without complication ([place])   . High cholesterol   . Hypertension     Patient Active Problem List   Diagnosis Date Noted  . Insulin dependent type 2 diabetes mellitus, uncontrolled ([place]) 04/06/2016  . High cholesterol 04/06/2016  . Hypertension 04/06/2016  . Endocrine disorder 04/06/2016  . Encounter for long-term current use of high risk medication 04/06/2016  . Obesity due to excess calories with serious comorbidity 04/06/2016  . Other mixed anxiety disorders 04/06/2016  . Depression 04/06/2016  . Female-to-female transgender person 04/06/2016  . PCOS (polycystic ovarian syndrome) 04/06/2016  . Vitamin D deficiency 04/06/2016    Past Surgical History:  Procedure Laterality Date  . ABDOMINAL HYSTERECTOMY      Current Outpatient Rx  . Order #: [id number] Class: Historical Med  . Order #: [id number] Class: Historical Med  . Order #: [id number] Class: Historical Med  . Order #: [id number] Class: Historical Med  . Order #: [id number] Class: Historical Med  . Order #: [id number] Class: Print  . Order #: [id number] Class: Historical Med  . Order #: [id number] Class: Normal  . Order #: [id number] Class: Historical Med  . Order #: [id number] Class: Historical Med  . Order #: [id number] Class: Print  . Order #: [id number] Class: Historical Med  . Order #:  [id number] Class: Historical Med  . Order #: [id number] Class: Historical Med  . Order #: [id number] Class: Print  . Order #: [id number] Class: Print  . Order #: [id number] Class: Historical Med  . Order #: [id number] Class: Historical Med  . Order #: [id number] Class: Print  . Order #: [id number] Class: Print    Allergies Penicillins; Gabapentin; Lactose intolerance (gi); Metformin and related; and Victoza [liraglutide]  Family History  Problem Relation Age of Onset  . Hypertension Father   . Diabetes Father   . Diabetes Sister   . Diabetes Maternal Grandmother   . Breast cancer Maternal Grandmother   . Cancer Other   . Diabetes Other   . Breast cancer Paternal Grandmother     Social History Social History   Tobacco Use  . Smoking status: Former Research scientist (life sciences)  . Smokeless tobacco: Never Used  Substance Use Topics  . Alcohol use: Never    Frequency: Never  . Drug use: No    Review of Systems  All other systems negative except as documented in the HPI. All pertinent positives and negatives as reviewed in the HPI. ____________________________________________   PHYSICAL EXAM:  VITAL SIGNS: ED Triage Vitals  Enc Vitals Group     BP 08/23/17 2048 136/85     Pulse Rate 08/23/17 2048 95     Resp 08/23/17 2048 16     Temp 08/23/17 2048 98.6 F (37 C)     Temp Source 08/23/17 2048 Oral     SpO2 08/23/17 2048 97 %     Weight 08/23/17 2045 279 lb (126.6 kg)  Height 08/23/17 2045 5\' 1"  (1.549 m)     Head Circumference --      Peak Flow --      Pain Score 08/23/17 2045 10     Pain Loc --      Pain Edu? --      Excl. in [place]? --     Constitutional: Alert and oriented. Well appearing and in no acute distress. Eyes: Conjunctivae are normal. PERRL. EOMI. Head: Atraumatic. Nose: No congestion/rhinnorhea. Mouth/Throat: Mucous membranes are moist.  Oropharynx non-erythematous. Neck: No stridor.  No meningeal signs.   Cardiovascular: Normal rate, regular rhythm. Good peripheral  circulation. Grossly normal heart sounds.   Respiratory: Normal respiratory effort.  No retractions. Lungs CTAB. Gastrointestinal: Soft and nontender. No distention.  Musculoskeletal:TTP to ROM of right wrist and ankle. Swelling to both. Neurologic:  Normal speech and language. No gross focal neurologic deficits are appreciated.  Skin:  Skin is warm, dry and intact. No rash noted. ____________________________________________  RADIOLOGY  Dg Wrist Complete Right  Result Date: 08/23/2017 CLINICAL DATA:  Patient fell in [employer] freezer section on wet floor. Landed on right knee and wrist; pain. EXAM: RIGHT WRIST - COMPLETE 3+ VIEW COMPARISON:  Wrist radiograph 03/28/2017 FINDINGS: There is no evidence of acute fracture or dislocation. Remote ulna styloid fracture versus accessory ossicle, unchanged. There is no evidence of arthropathy or other focal bone abnormality. Soft tissues are unremarkable. IMPRESSION: No acute fracture or dislocation of the right wrist. Electronically Signed   By: Jeb Levering M.D.   On: 08/23/2017 21:57   Dg Tibia/fibula Right  Result Date: 08/23/2017 CLINICAL DATA:  Patient fell in [employer] freezer section on wet floor. Landed on right wrist and knee, right lower extremity pain. EXAM: RIGHT TIBIA AND FIBULA - 2 VIEW COMPARISON:  None. FINDINGS: There is no evidence of fracture or other focal bone lesions. Knee and ankle alignment are maintained. Mild soft tissue edema versus habitus. IMPRESSION: No fracture or subluxation of the right lower leg. Electronically Signed   By: Jeb Levering M.D.   On: 08/23/2017 21:58    ____________________________________________   INITIAL IMPRESSION / ASSESSMENT AND PLAN / ED COURSE  Likely sprained joints. Ortho follow up if needed.   I discussed with the patient the possibility of a missed fracture. I told them that sometimes they are not seen on initial x-ray but if they had persistent pain for 5 to 7 days they would need to  follow-up with their primary doctor or an orthopedic doctor to get repeat evaluation and x-ray to evaluate for missed fracture or ligamentous injury and they stated understanding. Will employ RICE therapy with NSAIDs/tylenol in mean time.   Pertinent labs & imaging results that were available during my care of the patient were reviewed by me and considered in my medical decision making (see chart for details).  ____________________________________________  FINAL CLINICAL IMPRESSION(S) / ED DIAGNOSES  Final diagnoses:  Sprain of right wrist, initial encounter  Sprain of right ankle, unspecified ligament, initial encounter     MEDICATIONS GIVEN DURING THIS VISIT:  Medications  ketorolac (TORADOL) injection 30 mg (30 mg Intramuscular Not Given 08/23/17 2130)  oxyCODONE-acetaminophen (PERCOCET/ROXICET) 5-325 MG per tablet 2 tablet (2 tablets Oral Given 08/23/17 2129)     NEW OUTPATIENT MEDICATIONS STARTED DURING THIS VISIT:  Discharge Medication List as of 08/23/2017 10:28 PM      Note:  This note was prepared with assistance of Dragon voice recognition software. Occasional wrong-word or sound-a-like substitutions may have  occurred due to the inherent limitations of voice recognition software.   Juancarlos Crescenzo, Corene Cornea, MD 08/24/17 [phone]

## 2017-08-23 NOTE — ED Triage Notes (Signed)
Pt c/o RT ankle, knee and wrist pain s/p fall from standing position; fell onto RT knee

## 2017-12-13 ENCOUNTER — Other Ambulatory Visit: Payer: Self-pay

## 2017-12-13 ENCOUNTER — Emergency Department (HOSPITAL_COMMUNITY)
Admission: EM | Admit: 2017-12-13 | Discharge: 2017-12-14 | Disposition: A | Discharge status: Home / Self Care | Payer: Medicaid Other | Attending: Emergency Medicine | Admitting: Emergency Medicine

## 2017-12-13 ENCOUNTER — Encounter (HOSPITAL_COMMUNITY): Payer: Self-pay | Admitting: Emergency Medicine

## 2017-12-13 DIAGNOSIS — F329 Major depressive disorder, single episode, unspecified: Secondary | ICD-10-CM | POA: Insufficient documentation

## 2017-12-13 DIAGNOSIS — R45851 Suicidal ideations: Secondary | ICD-10-CM | POA: Insufficient documentation

## 2017-12-13 DIAGNOSIS — F32A Depression, unspecified: Secondary | ICD-10-CM | POA: Diagnosis present

## 2017-12-13 LAB — CBC
HCT: 49.2 % (ref 39.0–52.0)
Hemoglobin: 16.3 g/dL (ref 13.0–17.0)
MCH: 30 pg (ref 26.0–34.0)
MCHC: 33.1 g/dL (ref 30.0–36.0)
MCV: 90.4 fL (ref 80.0–100.0)
PLATELETS: 309 10*3/uL (ref 150–400)
RBC: 5.44 MIL/uL (ref 4.22–5.81)
RDW: 12.4 % (ref 11.5–15.5)
WBC: 17 10*3/uL — AB (ref 4.0–10.5)
nRBC: 0 % (ref 0.0–0.2)

## 2017-12-13 LAB — RAPID URINE DRUG SCREEN, HOSP PERFORMED
AMPHETAMINES: NOT DETECTED
Barbiturates: NOT DETECTED
Benzodiazepines: POSITIVE — AB
COCAINE: NOT DETECTED
Opiates: NOT DETECTED
Tetrahydrocannabinol: NOT DETECTED

## 2017-12-13 NOTE — ED Notes (Signed)
Bed: WTR7 Expected date:  Expected time:  Means of arrival:  Comments: 

## 2017-12-13 NOTE — ED Triage Notes (Signed)
Pt presents with complaints of SI and depression x1.5 -2 weeks. Patient states plan to take pills due to easy access. Patient states being treated outside of here but is in between therapists and has had a lot of stressors including a divorce.

## 2017-12-13 NOTE — ED Provider Notes (Signed)
[facility] DEPT Provider Note   CSN: [id number] Arrival date & time: 12/13/17  2147     History   Chief Complaint Chief Complaint  Patient presents with  . Suicidal    HPI Krista Williams is a 27 y.o. adult.  The history is provided by the patient, a parent and medical records. No language interpreter was used.  Mental Health Problem  Presenting symptoms: depression and suicidal thoughts   Presenting symptoms: no suicidal threats and no suicide attempt   Patient accompanied by:  Parent Degree of incapacity (severity):  Severe Onset quality:  Gradual Duration:  2 weeks Timing:  Constant Progression:  Worsening Chronicity:  Recurrent Context: alcohol use and stressful life event (divorce)   Treatment compliance:  Untreated Relieved by:  Nothing Worsened by:  Family interactions Ineffective treatments:  None tried Associated symptoms: no abdominal pain, no chest pain, no fatigue and no headaches     Past Medical History:  Diagnosis Date  . Autism   . Diabetes mellitus without complication ([place])   . High cholesterol   . Hypertension     Patient Active Problem List   Diagnosis Date Noted  . Insulin dependent type 2 diabetes mellitus, uncontrolled ([place]) 04/06/2016  . High cholesterol 04/06/2016  . Hypertension 04/06/2016  . Endocrine disorder 04/06/2016  . Encounter for long-term current use of high risk medication 04/06/2016  . Obesity due to excess calories with serious comorbidity 04/06/2016  . Other mixed anxiety disorders 04/06/2016  . Depression 04/06/2016  . Female-to-female transgender person 04/06/2016  . PCOS (polycystic ovarian syndrome) 04/06/2016  . Vitamin D deficiency 04/06/2016    Past Surgical History:  Procedure Laterality Date  . ABDOMINAL HYSTERECTOMY       OB History   None      Home Medications    Prior to Admission medications   Medication Sig Start Date End Date Taking? Authorizing Provider    ALPRAZolam Duanne Moron) 0.5 MG tablet Take 0.5 mg by mouth 2 (two) times daily as needed for anxiety or sleep.  09/14/16  Yes [provider]  amLODipine (NORVASC) 10 MG tablet Take 10 mg by mouth daily. 09/13/16  Yes [provider]  busPIRone (BUSPAR) 10 MG tablet Take 10 mg by mouth 2 (two) times daily.   Yes [provider]  cariprazine (VRAYLAR) capsule Take 3 mg by mouth daily.   Yes [provider]  citalopram (CELEXA) 40 MG tablet Take 40 mg by mouth every evening.    Yes [provider]  cycloSPORINE (RESTASIS) 0.05 % ophthalmic emulsion Place 1 drop into both eyes 2 (two) times daily as needed (dry eyes).    Yes [provider]  FLUoxetine (PROZAC) 20 MG tablet Take 20 mg by mouth daily.   Yes [provider]  hydrochlorothiazide (HYDRODIURIL) 25 MG tablet Take 1 tablet (25 mg total) by mouth daily. 12/10/12  Yes Reyne Dumas, MD  ibuprofen (ADVIL,MOTRIN) 800 MG tablet Take 1 tablet (800 mg total) by mouth every 8 (eight) hours as needed for mild pain. 03/28/17  Yes Ward, Cyril Mourning N, DO  insulin aspart (NOVOLOG) 100 UNIT/ML injection Inject 3-5 Units into the skin 3 (three) times daily before meals. Sliding scale   Yes [provider]  insulin glargine (LANTUS) 100 UNIT/ML injection Inject 0.65 mLs (65 Units total) into the skin 2 (two) times daily. Patient taking differently: Inject 70 Units into the skin 2 (two) times daily.  07/07/16  Yes Charlann Lange, PA-C  lamoTRIgine (  LAMICTAL) 100 MG tablet Take 100 mg by mouth 2 (two) times daily.   Yes [provider]  pantoprazole (PROTONIX) 20 MG tablet Take 20 mg by mouth daily after breakfast.    Yes [provider]  potassium chloride (K-DUR) 10 MEQ tablet Take 1 tablet (10 mEq total) by mouth daily for 4 days. 08/10/17 12/13/17 Yes Tegeler, Gwenyth Allegra, MD  prazosin (MINIPRESS) 2 MG capsule Take 2 mg by mouth at bedtime.   Yes [provider]   testosterone cypionate (DEPO-TESTOSTERONE) 200 MG/ML injection Inject 0.25 mLs (50 mg total) into the muscle every 7 (seven) days. Patient taking differently: Inject 100 mg into the muscle See admin instructions. Every 10 days 04/06/16  Yes Shawnee Knapp, MD  diclofenac sodium (VOLTAREN) 1 % GEL Apply 2 g topically 4 (four) times daily. Patient not taking: Reported on 12/13/2017 01/31/17   Caccavale, Sophia, PA-C  traMADol (ULTRAM) 50 MG tablet Take 1 tablet (50 mg total) by mouth every 6 (six) hours as needed. Patient not taking: Reported on 12/13/2017 04/21/17   Tegeler, Gwenyth Allegra, MD  UNABLE TO FIND CPAP: Nightly at bedtime    [provider]    Family History Family History  Problem Relation Age of Onset  . Hypertension Father   . Diabetes Father   . Diabetes Sister   . Diabetes Maternal Grandmother   . Breast cancer Maternal Grandmother   . Cancer Other   . Diabetes Other   . Breast cancer Paternal Grandmother     Social History Social History   Tobacco Use  . Smoking status: Former Research scientist (life sciences)  . Smokeless tobacco: Never Used  Substance Use Topics  . Alcohol use: Yes    Frequency: Never    Comment: Using past week and a half  . Drug use: No     Allergies   Gabapentin; Lactose intolerance (gi); Metformin and related; and Victoza [liraglutide]   Review of Systems Review of Systems  Constitutional: Negative for chills, diaphoresis, fatigue and fever.  HENT: Negative for congestion.   Eyes: Negative for visual disturbance.  Respiratory: Negative for cough, chest tightness, shortness of breath and stridor.   Cardiovascular: Negative for chest pain.  Gastrointestinal: Negative for abdominal pain, constipation, diarrhea, nausea and vomiting.  Genitourinary: Negative for flank pain and frequency.  Musculoskeletal: Negative for back pain.  Skin: Negative for rash and wound.  Neurological: Negative for headaches.  Psychiatric/Behavioral: Positive for suicidal  ideas. Negative for confusion.  All other systems reviewed and are negative.    Physical Exam Updated Vital Signs There were no vitals taken for this visit.  Physical Exam  Constitutional: He is oriented to person, place, and time. He appears well-developed and well-nourished. No distress.  HENT:  Head: Normocephalic and atraumatic.  Mouth/Throat: Oropharynx is clear and moist. No oropharyngeal exudate.  Eyes: Pupils are equal, round, and reactive to light. Conjunctivae and EOM are normal.  Neck: Normal range of motion. Neck supple.  Cardiovascular: Normal rate and intact distal pulses.  No murmur heard. Pulmonary/Chest: Effort normal and breath sounds normal. No respiratory distress. He has no wheezes. He has no rales. He exhibits no tenderness.  Abdominal: Soft. Bowel sounds are normal. He exhibits no distension. There is no guarding.  Musculoskeletal: He exhibits no tenderness.  Neurological: He is alert and oriented to person, place, and time. No cranial nerve deficit or sensory deficit. He exhibits normal muscle tone.  Skin: Skin is warm. Capillary refill takes less than 2 seconds. He  is not diaphoretic. No erythema. No pallor.  Psychiatric: He is not agitated and not actively hallucinating. Thought content is not paranoid. He exhibits a depressed mood. He expresses suicidal ideation. He expresses no homicidal ideation. He expresses suicidal plans. He expresses no homicidal plans.  Nursing note and vitals reviewed.    ED Treatments / Results  Labs (all labs ordered are listed, but only abnormal results are displayed) Labs Reviewed  COMPREHENSIVE METABOLIC PANEL - Abnormal; Notable for the following components:      Result Value   Glucose, Bld 119 (*)    Total Bilirubin 0.2 (*)    All other components within normal limits  ACETAMINOPHEN LEVEL - Abnormal; Notable for the following components:   Acetaminophen (Tylenol), Serum <10 (*)    All other components within normal  limits  CBC - Abnormal; Notable for the following components:   WBC 17.0 (*)    All other components within normal limits  RAPID URINE DRUG SCREEN, HOSP PERFORMED - Abnormal; Notable for the following components:   Benzodiazepines POSITIVE (*)    All other components within normal limits  ETHANOL  SALICYLATE LEVEL    EKG None  Radiology No results found.  Procedures Procedures (including critical care time)  Medications Ordered in ED Medications  ALPRAZolam (XANAX) tablet 0.5 mg (has no administration in time range)  amLODipine (NORVASC) tablet 10 mg (has no administration in time range)  busPIRone (BUSPAR) tablet 10 mg (has no administration in time range)  citalopram (CELEXA) tablet 40 mg (has no administration in time range)  FLUoxetine (PROZAC) tablet 20 mg (has no administration in time range)  hydrochlorothiazide (HYDRODIURIL) tablet 25 mg (has no administration in time range)  ibuprofen (ADVIL,MOTRIN) tablet 800 mg (has no administration in time range)  insulin aspart (novoLOG) injection 3-5 Units (has no administration in time range)  insulin glargine (LANTUS) injection 70 Units (has no administration in time range)  lamoTRIgine (LAMICTAL) tablet 100 mg (has no administration in time range)  pantoprazole (PROTONIX) EC tablet 20 mg (has no administration in time range)  prazosin (MINIPRESS) capsule 2 mg (has no administration in time range)     Initial Impression / Assessment and Plan / ED Course  I have reviewed the triage vital signs and the nursing notes.  Pertinent labs & imaging results that were available during my care of the patient were reviewed by me and considered in my medical decision making (see chart for details).     Hadassah Rana is a 27 y.o. adult female to female transgender patient with a past medical history significant for diabetes, hypertension, hypercholesterolemia, PCOS, and autism who presents for suicidal ideation.  Patient reports that  he recently became divorced from his wife within the last month and over the last 2 weeks has had worsening depression.  He reports that he is having suicidal ideation and has a plan to overdose.  He reports he has access to the medications to overdose.  He reports he is tried to kill himself in the past.  He denies any homicidal ideation, audiovisual hallucinations, or other complaints.  He denies any fevers, chills, chest pain, shortness of breath, nausea vomiting, or any physical symptoms.  On exam, lungs are clear.  Chest is nontender.  Abdomen is nontender.  No focal neurologic deficits.  Patient resting comfortably.  Patient will have screening laboratory testing for medical clearance.  Anticipate speaking with TTS for further management.    At this time, patient is here with family and  is voluntary for management of suicidal ideation with plan for overdose and access to the means of suicide.     12:34 AM Patient's labs are reassuring.  Similar leukocytosis to prior.  Patient is medically cleared.  Home medicines ordered.  Psychiatry reports patient is appropriate for inpatient.  They will try to place patient.  Patient will await placement.   Final Clinical Impressions(s) / ED Diagnoses   Final diagnoses:  Suicidal ideation     Clinical Impression: 1. Suicidal ideation     Disposition: Awaiting final TTS recommendations and placement  This note was prepared with assistance of Dragon voice recognition software. Occasional wrong-word or sound-a-like substitutions may have occurred due to the inherent limitations of voice recognition software.     Tegeler, Gwenyth Allegra, MD 12/14/17 [phone]

## 2017-12-14 LAB — COMPREHENSIVE METABOLIC PANEL
ALT: 37 U/L (ref 0–44)
AST: 20 U/L (ref 15–41)
Albumin: 4.3 g/dL (ref 3.5–5.0)
Alkaline Phosphatase: 65 U/L (ref 38–126)
Anion gap: 10 (ref 5–15)
BUN: 15 mg/dL (ref 6–20)
CHLORIDE: 103 mmol/L (ref 98–111)
CO2: 28 mmol/L (ref 22–32)
CREATININE: 0.77 mg/dL (ref 0.61–1.24)
Calcium: 9.7 mg/dL (ref 8.9–10.3)
GFR calc non Af Amer: >60 mL/min (ref >60)
Glucose, Bld: 119 mg/dL — ABNORMAL HIGH (ref 70–99)
Potassium: 3.7 mmol/L (ref 3.5–5.1)
SODIUM: 141 mmol/L (ref 135–145)
Total Bilirubin: 0.2 mg/dL — ABNORMAL LOW (ref 0.3–1.2)
Total Protein: 7.9 g/dL (ref 6.5–8.1)

## 2017-12-14 LAB — CBG MONITORING, ED
GLUCOSE-CAPILLARY: 137 mg/dL — AB (ref 70–99)
Glucose-Capillary: 113 mg/dL — ABNORMAL HIGH (ref 70–99)
Glucose-Capillary: 155 mg/dL — ABNORMAL HIGH (ref 70–99)

## 2017-12-14 LAB — ACETAMINOPHEN LEVEL

## 2017-12-14 LAB — ETHANOL: Alcohol, Ethyl (B): <10 mg/dL (ref <10)

## 2017-12-14 LAB — SALICYLATE LEVEL

## 2017-12-14 MED ORDER — IBUPROFEN 800 MG PO TABS: 800.0000 mg | ORAL_TABLET | Freq: Three times a day (TID) | ORAL | Status: DC | PRN

## 2017-12-14 MED ORDER — HYDROCHLOROTHIAZIDE 25 MG PO TABS
25.0000 mg | ORAL_TABLET | Freq: Every day | ORAL | Status: DC
  Administered 2017-12-14: 25 mg via ORAL
  Filled 2017-12-14: qty 1

## 2017-12-14 MED ORDER — LAMOTRIGINE 100 MG PO TABS
100.0000 mg | ORAL_TABLET | Freq: Two times a day (BID) | ORAL | Status: DC
  Administered 2017-12-14 (×2): 100 mg via ORAL
  Filled 2017-12-14 (×2): qty 1

## 2017-12-14 MED ORDER — FLUOXETINE HCL 20 MG PO CAPS
20.0000 mg | ORAL_CAPSULE | Freq: Every day | ORAL | Status: DC
  Administered 2017-12-14: 20 mg via ORAL
  Filled 2017-12-14: qty 1

## 2017-12-14 MED ORDER — PANTOPRAZOLE SODIUM 20 MG PO TBEC
20.0000 mg | DELAYED_RELEASE_TABLET | Freq: Every day | ORAL | Status: DC
  Administered 2017-12-14: 20 mg via ORAL
  Filled 2017-12-14: qty 1

## 2017-12-14 MED ORDER — INSULIN ASPART 100 UNIT/ML ~~LOC~~ SOLN: 0.0000 [IU] | Freq: Three times a day (TID) | SUBCUTANEOUS | Status: DC

## 2017-12-14 MED ORDER — INSULIN GLARGINE 100 UNIT/ML ~~LOC~~ SOLN
70.0000 [IU] | Freq: Two times a day (BID) | SUBCUTANEOUS | Status: DC
  Administered 2017-12-14: 70 [IU] via SUBCUTANEOUS
  Filled 2017-12-14 (×2): qty 0.7

## 2017-12-14 MED ORDER — INSULIN ASPART 100 UNIT/ML ~~LOC~~ SOLN: 0.0000 [IU] | Freq: Every day | SUBCUTANEOUS | Status: DC

## 2017-12-14 MED ORDER — FLUOXETINE HCL 20 MG PO TABS
20.0000 mg | ORAL_TABLET | Freq: Every day | ORAL | Status: DC
  Filled 2017-12-14: qty 1

## 2017-12-14 MED ORDER — PRAZOSIN HCL 1 MG PO CAPS: 2.0000 mg | ORAL_CAPSULE | Freq: Every day | ORAL | Status: DC

## 2017-12-14 MED ORDER — ALPRAZOLAM 0.5 MG PO TABS
0.5000 mg | ORAL_TABLET | Freq: Two times a day (BID) | ORAL | Status: DC | PRN
  Administered 2017-12-14: 0.5 mg via ORAL
  Filled 2017-12-14: qty 1

## 2017-12-14 MED ORDER — BUSPIRONE HCL 10 MG PO TABS
10.0000 mg | ORAL_TABLET | Freq: Two times a day (BID) | ORAL | Status: DC
  Administered 2017-12-14 (×2): 10 mg via ORAL
  Filled 2017-12-14 (×2): qty 1

## 2017-12-14 MED ORDER — INSULIN ASPART 100 UNIT/ML ~~LOC~~ SOLN: 3.0000 [IU] | Freq: Three times a day (TID) | SUBCUTANEOUS | Status: DC

## 2017-12-14 MED ORDER — AMLODIPINE BESYLATE 5 MG PO TABS
10.0000 mg | ORAL_TABLET | Freq: Every day | ORAL | Status: DC
  Administered 2017-12-14: 10 mg via ORAL
  Filled 2017-12-14: qty 2

## 2017-12-14 MED ORDER — CITALOPRAM HYDROBROMIDE 10 MG PO TABS: 40.0000 mg | ORAL_TABLET | Freq: Every evening | ORAL | Status: DC

## 2017-12-14 NOTE — ED Notes (Signed)
Security called to wand and transport patient to [facility]

## 2017-12-14 NOTE — ED Notes (Signed)
Pt mother at bedside, OK to discuss plan with mother per pt.  Pt and mom are aware of the plan to admit him, pt is requesting to go home.  Pt denies si/hi/avh at t his time, and states that he is safe to go home.  Mom reports that he has a good support system, and that she feels that he is safe to go home.  Will notify Dr Mariea Clonts.

## 2017-12-14 NOTE — [facility] Suicide Risk Assessment (Cosign Needed)
Suicide Risk Assessment  Discharge Assessment   [facility] Discharge Suicide Risk Assessment   Principal Problem: <principal problem not specified> Discharge Diagnoses: Active Problems:   * No active hospital problems. *   Total Time spent with patient: 30 minutes  Musculoskeletal: Strength & Muscle Tone: within normal limits Gait & Station: normal Patient leans: N/A  Psychiatric Specialty Exam:   Blood pressure 121/61, pulse 78, temperature 97.8 F (36.6 C), temperature source Oral, resp. rate 20, SpO2 93 %.There is no height or weight on file to calculate BMI.  General Appearance: Casual  Eye Contact::  Good  Speech:  Clear and Coherent409  Volume:  Normal  Mood:  Anxious and Depressed  Affect:  Congruent  Thought Process:  Coherent, Goal Directed, Linear and Descriptions of Associations: Intact  Orientation:  Full (Time, Place, and Person)  Thought Content:  Logical  Suicidal Thoughts:  No  Homicidal Thoughts:  No  Memory:  Immediate;   Good Recent;   Good Remote;   Fair  Judgement:  Fair  Insight:  Fair  Psychomotor Activity:  Normal  Concentration:  Good  Recall:  Good  Fund of Knowledge:Good  Language: Good  Akathisia:  No  Handed:  Right  AIMS (if indicated):     Assets:  Agricultural consultant Housing Physical Health Resilience Social Support Vocational/Educational  Sleep:     Cognition: WNL  ADL's:  Intact   Mental Status Per Nursing Assessment::   On Admission:   Pt is anxious and depressed due to his upcoming divorce. Pt denies any self-harm attempts and has a solid family support system. He is currently living with his parents. He is is not currently on any medications for his mood. He has being seeing a therapist at [facility] but that person quit and he has an appointment in 3 weeks with the new therapist. Pt will be provided with additional resources for outpatient psychiatry and therapy. Pt is psychiatrically clear for  discharge.   Demographic Factors:  Adolescent or young adult, Caucasian, Low socioeconomic status and Unemployed  Loss Factors: Financial problems/change in socioeconomic status   Risk Reduction Factors:   Sense of responsibility to family and Living with another person, especially a relative  Continued Clinical Symptoms:  Severe Anxiety and/or Agitation Depression:   Impulsivity  Cognitive Features That Contribute To Risk:  Closed-mindedness    Suicide Risk:  Minimal: No identifiable suicidal ideation.  Patients presenting with no risk factors but with morbid ruminations; may be classified as minimal risk based on the severity of the depressive symptoms   Plan Of Care/Follow-up recommendations:  Activity:  as tolerated Diet:  Heart healthy  Ethelene Hal, NP 12/14/2017, 12:00 PM

## 2017-12-14 NOTE — ED Notes (Signed)
Pt's mother into see 
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
## 2017-12-14 NOTE — ED Notes (Signed)
Bed: [facility] Expected date:  Expected time:  Means of arrival:  Comments: Tr7

## 2017-12-14 NOTE — BHH Counselor (Addendum)
Pt goes by the name Krista Williams. Pt wanted to know if his service dog could visit while in inpatient treatment.   Vertell Novak, MS, [facility], [facility] Triage Specialist [phone]

## 2017-12-14 NOTE — ED Notes (Addendum)
Written dc instructions and referal information reviewed with patient and Mom.  Pt encouraged to take his medications as directed and contact Family services for follow up.  Pt denies si/hi/avh on dc, and was encouraged to seek treatment for changes, mobile crisis information also given and reviewed.  Pt and Mom verbalized understanding.  Pt ambulatory w/o difficulty to dc area w/ mHt, belongings brought by mom returned after leaving the area.

## 2017-12-14 NOTE — ED Notes (Signed)
Pt alert and oriented. Pt denies any pain or discomfort. Pt sad at this time. Pt denies any si,hi, or avh at this time. Pt stated that he's depressed. Pt contract to safety. Pt stable will continue to monitor.

## 2017-12-25 ENCOUNTER — Other Ambulatory Visit: Payer: Self-pay

## 2017-12-25 ENCOUNTER — Emergency Department (HOSPITAL_BASED_OUTPATIENT_CLINIC_OR_DEPARTMENT_OTHER)
Admission: EM | Admit: 2017-12-25 | Discharge: 2017-12-26 | Disposition: A | Discharge status: Home / Self Care | Payer: Medicaid Other | Attending: Emergency Medicine | Admitting: Emergency Medicine

## 2017-12-25 ENCOUNTER — Encounter (HOSPITAL_BASED_OUTPATIENT_CLINIC_OR_DEPARTMENT_OTHER): Payer: Self-pay | Admitting: *Deleted

## 2017-12-25 DIAGNOSIS — R42 Dizziness and giddiness: Secondary | ICD-10-CM | POA: Diagnosis not present

## 2017-12-25 DIAGNOSIS — Z794 Long term (current) use of insulin: Secondary | ICD-10-CM | POA: Insufficient documentation

## 2017-12-25 DIAGNOSIS — N390 Urinary tract infection, site not specified: Secondary | ICD-10-CM | POA: Insufficient documentation

## 2017-12-25 DIAGNOSIS — Z87891 Personal history of nicotine dependence: Secondary | ICD-10-CM | POA: Diagnosis not present

## 2017-12-25 DIAGNOSIS — F840 Autistic disorder: Secondary | ICD-10-CM | POA: Diagnosis not present

## 2017-12-25 DIAGNOSIS — Z79899 Other long term (current) drug therapy: Secondary | ICD-10-CM | POA: Diagnosis not present

## 2017-12-25 DIAGNOSIS — E119 Type 2 diabetes mellitus without complications: Secondary | ICD-10-CM | POA: Insufficient documentation

## 2017-12-25 LAB — CBG MONITORING, ED: Glucose-Capillary: 163 mg/dL — ABNORMAL HIGH (ref 70–99)

## 2017-12-25 MED ORDER — SODIUM CHLORIDE 0.9 % IV BOLUS
1000.0000 mL | Freq: Once | INTRAVENOUS | Status: AC
  Administered 2017-12-25: 1000 mL via INTRAVENOUS

## 2017-12-25 NOTE — ED Provider Notes (Signed)
[place] DEPT [facility] Provider Note: Georgena Spurling, MD, FACEP  CSN: [id number] MRN: [id number] ARRIVAL: 12/25/17 at [place]: [place]  12/26/17 2:13 AM Krista Williams is a 27 y.o. female to female transgender person with a history of diabetes.  The patient is complaining of about 3 days of dizziness meaning a sense of the room spinning and feeling faint.  The patient's blood sugar has also been higher than usual despite being compliant with anti-hyperglycemics.  The patient complains of dry mouth but not increased urinary frequency.   Past Medical History:  Diagnosis Date  . Autism   . Diabetes mellitus without complication ([place])   . High cholesterol   . Hypertension     Past Surgical History:  Procedure Laterality Date  . ABDOMINAL HYSTERECTOMY      Family History  Problem Relation Age of Onset  . Hypertension Father   . Diabetes Father   . Diabetes Sister   . Diabetes Maternal Grandmother   . Breast cancer Maternal Grandmother   . Cancer Other   . Diabetes Other   . Breast cancer Paternal Grandmother     Social History   Tobacco Use  . Smoking status: Former Research scientist (life sciences)  . Smokeless tobacco: Never Used  Substance Use Topics  . Alcohol use: Yes    Frequency: Never    Comment: Using past week and a half  . Drug use: No    Prior to Admission medications   Medication Sig Start Date End Date Taking? Authorizing Provider  ALPRAZolam Duanne Moron) 0.5 MG tablet Take 0.5 mg by mouth 2 (two) times daily as needed for anxiety or sleep.  09/14/16   [provider]  amLODipine (NORVASC) 10 MG tablet Take 10 mg by mouth daily. 09/13/16   [provider]  cycloSPORINE (RESTASIS) 0.05 % ophthalmic emulsion Place 1 drop into both eyes 2 (two) times daily as needed (dry eyes).     [provider]  diclofenac sodium (VOLTAREN) 1 % GEL Apply 2 g topically 4 (four) times daily. Patient not taking:  Reported on 12/13/2017 01/31/17   Caccavale, Sophia, PA-C  FLUoxetine (PROZAC) 20 MG tablet Take 20 mg by mouth daily.    [provider]  hydrochlorothiazide (HYDRODIURIL) 25 MG tablet Take 1 tablet (25 mg total) by mouth daily. 12/10/12   Reyne Dumas, MD  ibuprofen (ADVIL,MOTRIN) 800 MG tablet Take 1 tablet (800 mg total) by mouth every 8 (eight) hours as needed for mild pain. 03/28/17   Ward, Delice Bison, DO  insulin aspart (NOVOLOG) 100 UNIT/ML injection Inject 3-5 Units into the skin 3 (three) times daily before meals. Sliding scale    [provider]  insulin glargine (LANTUS) 100 UNIT/ML injection Inject 0.65 mLs (65 Units total) into the skin 2 (two) times daily. Patient taking differently: Inject 70 Units into the skin 2 (two) times daily.  07/07/16   Charlann Lange, PA-C  Multiple Vitamin (MULTIVITAMIN WITH MINERALS) TABS tablet Take 1 tablet by mouth daily.    [provider]  pantoprazole (PROTONIX) 20 MG tablet Take 20 mg by mouth daily after breakfast.     [provider]  potassium chloride (K-DUR) 10 MEQ tablet Take 1 tablet (10 mEq total) by mouth daily for 4 days. 08/10/17 12/13/17  Tegeler, Gwenyth Allegra, MD  prazosin (MINIPRESS) 2 MG capsule Take 2 mg by mouth at bedtime.    [provider]  testosterone cypionate (DEPO-TESTOSTERONE)  200 MG/ML injection Inject 0.25 mLs (50 mg total) into the muscle every 7 (seven) days. Patient taking differently: Inject 100 mg into the muscle See admin instructions. Every 10 days 04/06/16   Shawnee Knapp, MD  traZODone (DESYREL) 100 MG tablet Take 100 mg by mouth at bedtime.     [provider]    Allergies Gabapentin; Lactose intolerance (gi); Metformin and related; and Victoza [liraglutide]   REVIEW OF SYSTEMS  Negative except as noted here or in the History of Present Illness.   PHYSICAL EXAMINATION  Initial Vital Signs Blood pressure (!) 154/91, pulse 98, temperature 98.3 F (36.8 C),  resp. rate 18, height 5\' 1"  (1.549 m), weight 88.9 kg, SpO2 96 %.  Examination General: Well-developed, well-nourished adult in no acute distress; appearance consistent with age of record HENT: normocephalic; atraumatic Eyes: pupils equal, round and reactive to light; extraocular muscles intact Neck: supple Heart: regular rate and rhythm Lungs: clear to auscultation bilaterally Abdomen: soft; nondistended; diffuse tenderness; bowel sounds present Extremities: No deformity; full range of motion; pulses normal Neurologic: Awake, alert and oriented; motor function intact in all extremities and symmetric; no facial droop Skin: Warm and dry Psychiatric: Normal mood and affect   RESULTS  Summary of this visit's results, reviewed by myself:   EKG Interpretation  Date/Time:    Ventricular Rate:    PR Interval:    QRS Duration:   QT Interval:    QTC Calculation:   R Axis:     Text Interpretation:        Laboratory Studies: Results for orders placed or performed during the hospital encounter of 12/25/17 (from the past 24 hour(s))  CBC with Differential/Platelet     Status: Abnormal   Collection Time: 12/25/17 11:31 PM  Result Value Ref Range   WBC 16.2 (H) 4.0 - 10.5 K/uL   RBC 5.60 4.22 - 5.81 MIL/uL   Hemoglobin 16.1 13.0 - 17.0 g/dL   HCT 50.3 39.0 - 52.0 %   MCV 89.8 80.0 - 100.0 fL   MCH 28.8 26.0 - 34.0 pg   MCHC 32.0 30.0 - 36.0 g/dL   RDW 12.8 11.5 - 15.5 %   Platelets 296 150 - 400 K/uL   nRBC 0.0 0.0 - 0.2 %   Neutrophils Relative % 69 %   Neutro Abs 11.3 (H) 1.7 - 7.7 K/uL   Lymphocytes Relative 22 %   Lymphs Abs 3.6 0.7 - 4.0 K/uL   Monocytes Relative 6 %   Monocytes Absolute 0.9 0.1 - 1.0 K/uL   Eosinophils Relative 2 %   Eosinophils Absolute 0.3 0.0 - 0.5 K/uL   Basophils Relative 0 %   Basophils Absolute 0.1 0.0 - 0.1 K/uL   Immature Granulocytes 1 %   Abs Immature Granulocytes 0.11 (H) 0.00 - 0.07 K/uL  Basic metabolic panel     Status: Abnormal    Collection Time: 12/25/17 11:31 PM  Result Value Ref Range   Sodium 137 135 - 145 mmol/L   Potassium 3.3 (L) 3.5 - 5.1 mmol/L   Chloride 101 98 - 111 mmol/L   CO2 27 22 - 32 mmol/L   Glucose, Bld 165 (H) 70 - 99 mg/dL   BUN 15 6 - 20 mg/dL   Creatinine, Ser 0.88 0.61 - 1.24 mg/dL   Calcium 9.4 8.9 - 10.3 mg/dL   GFR calc non Af Amer >60 >60 mL/min   GFR calc Af Amer >60 >60 mL/min   Anion gap 9 5 -  15  POC CBG, ED     Status: Abnormal   Collection Time: 12/25/17 11:39 PM  Result Value Ref Range   Glucose-Capillary 163 (H) 70 - 99 mg/dL  Urinalysis, Routine w reflex microscopic     Status: Abnormal   Collection Time: 12/26/17  1:41 AM  Result Value Ref Range   Color, Urine YELLOW YELLOW   APPearance CLEAR CLEAR   Specific Gravity, Urine 1.025 1.005 - 1.030   pH 6.5 5.0 - 8.0   Glucose, UA NEGATIVE NEGATIVE mg/dL   Hgb urine dipstick NEGATIVE NEGATIVE   Bilirubin Urine NEGATIVE NEGATIVE   Ketones, ur 15 (A) NEGATIVE mg/dL   Protein, ur 30 (A) NEGATIVE mg/dL   Nitrite NEGATIVE NEGATIVE   Leukocytes, UA SMALL (A) NEGATIVE  Urinalysis, Microscopic (reflex)     Status: Abnormal   Collection Time: 12/26/17  1:41 AM  Result Value Ref Range   RBC / HPF 0-5 0 - 5 RBC/hpf   WBC, UA 21-50 0 - 5 WBC/hpf   Bacteria, UA FEW (A) NONE SEEN   Squamous Epithelial / LPF 0-5 0 - 5   Imaging Studies: No results found.  ED COURSE and MDM  Nursing notes and initial vitals signs, including pulse oximetry, reviewed.  Vitals:   12/25/17 2322  BP: (!) 154/91  Pulse: 98  Resp: 18  Temp: 98.3 F (36.8 C)  SpO2: 96%  Weight: 88.9 kg  Height: 5\' 1"  (1.549 m)   2:12 AM Patient feeling better after IV fluid bolus.  Consistent with early urinary tract infection.  PROCEDURES    ED DIAGNOSES     ICD-10-CM   1. Dizziness R42   2. Lower urinary tract infectious disease N39.0        Mabrey Howland, MD 12/26/17 [phone]

## 2017-12-25 NOTE — ED Triage Notes (Signed)
Pt c/o dizziness and increased blood sugar

## 2017-12-26 LAB — URINALYSIS, MICROSCOPIC (REFLEX)

## 2017-12-26 LAB — BASIC METABOLIC PANEL
Anion gap: 9 (ref 5–15)
BUN: 15 mg/dL (ref 6–20)
CALCIUM: 9.4 mg/dL (ref 8.9–10.3)
CO2: 27 mmol/L (ref 22–32)
Chloride: 101 mmol/L (ref 98–111)
Creatinine, Ser: 0.88 mg/dL (ref 0.61–1.24)
GFR calc Af Amer: >60 mL/min (ref >60)
GLUCOSE: 165 mg/dL — AB (ref 70–99)
Potassium: 3.3 mmol/L — ABNORMAL LOW (ref 3.5–5.1)
Sodium: 137 mmol/L (ref 135–145)

## 2017-12-26 LAB — URINALYSIS, ROUTINE W REFLEX MICROSCOPIC
Bilirubin Urine: NEGATIVE
GLUCOSE, UA: NEGATIVE mg/dL
HGB URINE DIPSTICK: NEGATIVE
KETONES UR: 15 mg/dL — AB
Nitrite: NEGATIVE
PH: 6.5 (ref 5.0–8.0)
PROTEIN: 30 mg/dL — AB
Specific Gravity, Urine: 1.025 (ref 1.005–1.030)

## 2017-12-26 LAB — CBC WITH DIFFERENTIAL/PLATELET
Abs Immature Granulocytes: 0.11 10*3/uL — ABNORMAL HIGH (ref 0.00–0.07)
BASOS PCT: 0 %
Basophils Absolute: 0.1 10*3/uL (ref 0.0–0.1)
EOS PCT: 2 %
Eosinophils Absolute: 0.3 10*3/uL (ref 0.0–0.5)
HEMATOCRIT: 50.3 % (ref 39.0–52.0)
Hemoglobin: 16.1 g/dL (ref 13.0–17.0)
Immature Granulocytes: 1 %
LYMPHS ABS: 3.6 10*3/uL (ref 0.7–4.0)
Lymphocytes Relative: 22 %
MCH: 28.8 pg (ref 26.0–34.0)
MCHC: 32 g/dL (ref 30.0–36.0)
MCV: 89.8 fL (ref 80.0–100.0)
MONO ABS: 0.9 10*3/uL (ref 0.1–1.0)
MONOS PCT: 6 %
NEUTROS PCT: 69 %
Neutro Abs: 11.3 10*3/uL — ABNORMAL HIGH (ref 1.7–7.7)
PLATELETS: 296 10*3/uL (ref 150–400)
RBC: 5.6 MIL/uL (ref 4.22–5.81)
RDW: 12.8 % (ref 11.5–15.5)
WBC: 16.2 10*3/uL — ABNORMAL HIGH (ref 4.0–10.5)
nRBC: 0 % (ref 0.0–0.2)

## 2017-12-26 MED ORDER — CEPHALEXIN 250 MG PO CAPS
500.0000 mg | ORAL_CAPSULE | Freq: Once | ORAL | Status: AC
  Administered 2017-12-26: 500 mg via ORAL
  Filled 2017-12-26: qty 2

## 2017-12-26 MED ORDER — CEPHALEXIN 500 MG PO CAPS: 500.0000 mg | ORAL_CAPSULE | Freq: Two times a day (BID) | ORAL | 0 refills | Status: DC

## 2017-12-26 NOTE — ED Notes (Signed)
C/o dizziness, ha  High cbg, and dry mouth

## 2017-12-27 LAB — URINE CULTURE: Culture: 50000 — AB

## 2018-04-26 ENCOUNTER — Other Ambulatory Visit: Payer: Self-pay

## 2018-04-26 ENCOUNTER — Emergency Department (HOSPITAL_BASED_OUTPATIENT_CLINIC_OR_DEPARTMENT_OTHER)
Admission: EM | Admit: 2018-04-26 | Discharge: 2018-04-26 | Disposition: A | Discharge status: Home / Self Care | Payer: Medicaid Other | Attending: Emergency Medicine | Admitting: Emergency Medicine

## 2018-04-26 ENCOUNTER — Encounter (HOSPITAL_BASED_OUTPATIENT_CLINIC_OR_DEPARTMENT_OTHER): Payer: Self-pay

## 2018-04-26 DIAGNOSIS — Z79899 Other long term (current) drug therapy: Secondary | ICD-10-CM | POA: Insufficient documentation

## 2018-04-26 DIAGNOSIS — N390 Urinary tract infection, site not specified: Secondary | ICD-10-CM | POA: Insufficient documentation

## 2018-04-26 DIAGNOSIS — Z794 Long term (current) use of insulin: Secondary | ICD-10-CM | POA: Diagnosis not present

## 2018-04-26 DIAGNOSIS — E1165 Type 2 diabetes mellitus with hyperglycemia: Secondary | ICD-10-CM | POA: Insufficient documentation

## 2018-04-26 DIAGNOSIS — R110 Nausea: Secondary | ICD-10-CM | POA: Diagnosis present

## 2018-04-26 DIAGNOSIS — Z87891 Personal history of nicotine dependence: Secondary | ICD-10-CM | POA: Diagnosis not present

## 2018-04-26 DIAGNOSIS — I10 Essential (primary) hypertension: Secondary | ICD-10-CM | POA: Diagnosis not present

## 2018-04-26 DIAGNOSIS — F840 Autistic disorder: Secondary | ICD-10-CM | POA: Diagnosis not present

## 2018-04-26 DIAGNOSIS — R739 Hyperglycemia, unspecified: Secondary | ICD-10-CM

## 2018-04-26 LAB — COMPREHENSIVE METABOLIC PANEL
ALT: 41 U/L (ref 0–44)
AST: 21 U/L (ref 15–41)
Albumin: 4.2 g/dL (ref 3.5–5.0)
Alkaline Phosphatase: 61 U/L (ref 38–126)
Anion gap: 9 (ref 5–15)
BUN: 11 mg/dL (ref 6–20)
CO2: 29 mmol/L (ref 22–32)
Calcium: 9.3 mg/dL (ref 8.9–10.3)
Chloride: 98 mmol/L (ref 98–111)
Creatinine, Ser: 0.73 mg/dL (ref 0.61–1.24)
GFR calc Af Amer: >60 mL/min (ref >60)
GFR calc non Af Amer: >60 mL/min (ref >60)
Glucose, Bld: 251 mg/dL — ABNORMAL HIGH (ref 70–99)
Potassium: 4.1 mmol/L (ref 3.5–5.1)
Sodium: 136 mmol/L (ref 135–145)
Total Bilirubin: 0.5 mg/dL (ref 0.3–1.2)
Total Protein: 7.4 g/dL (ref 6.5–8.1)

## 2018-04-26 LAB — URINALYSIS, ROUTINE W REFLEX MICROSCOPIC
Bilirubin Urine: NEGATIVE
Glucose, UA: >=500 mg/dL — AB
Hgb urine dipstick: NEGATIVE
Ketones, ur: 15 mg/dL — AB
Nitrite: NEGATIVE
Protein, ur: NEGATIVE mg/dL
Specific Gravity, Urine: >1.03 — ABNORMAL HIGH (ref 1.005–1.030)
pH: 5.5 (ref 5.0–8.0)

## 2018-04-26 LAB — URINALYSIS, MICROSCOPIC (REFLEX)
RBC / HPF: NONE SEEN RBC/hpf (ref 0–5)
WBC, UA: >50 WBC/hpf (ref 0–5)

## 2018-04-26 LAB — CBC
HCT: 43.9 % (ref 39.0–52.0)
Hemoglobin: 13.4 g/dL (ref 13.0–17.0)
MCH: 25.5 pg — ABNORMAL LOW (ref 26.0–34.0)
MCHC: 30.5 g/dL (ref 30.0–36.0)
MCV: 83.6 fL (ref 80.0–100.0)
Platelets: 294 10*3/uL (ref 150–400)
RBC: 5.25 MIL/uL (ref 4.22–5.81)
RDW: 14.8 % (ref 11.5–15.5)
WBC: 10.5 10*3/uL (ref 4.0–10.5)
nRBC: 0 % (ref 0.0–0.2)

## 2018-04-26 LAB — CBG MONITORING, ED
Glucose-Capillary: 247 mg/dL — ABNORMAL HIGH (ref 70–99)
Glucose-Capillary: 208 mg/dL — ABNORMAL HIGH (ref 70–99)

## 2018-04-26 MED ORDER — INSULIN GLARGINE 100 UNIT/ML ~~LOC~~ SOLN
70.0000 [IU] | Freq: Once | SUBCUTANEOUS | Status: AC
  Administered 2018-04-26: 10:00:00 70 [IU] via SUBCUTANEOUS

## 2018-04-26 MED ORDER — SODIUM CHLORIDE 0.9 % IV BOLUS
1000.0000 mL | Freq: Once | INTRAVENOUS | Status: AC
  Administered 2018-04-26: 1000 mL via INTRAVENOUS

## 2018-04-26 MED ORDER — CEPHALEXIN 500 MG PO CAPS: 500.0000 mg | ORAL_CAPSULE | Freq: Two times a day (BID) | ORAL | 0 refills | Status: DC

## 2018-04-26 MED ORDER — INSULIN GLARGINE 100 UNIT/ML ~~LOC~~ SOLN
SUBCUTANEOUS | Status: AC
  Filled 2018-04-26: qty 1

## 2018-04-26 NOTE — ED Notes (Addendum)
Lantus insulin has been incorrectly entered into the pyxis as 100U/L instead of 100U/mL. Override was performed, correct amount pulled from pyxis and double verified between Burman Nieves, Therapist, sports and [employer], Therapist, sports. Charge nurse has been made aware of error.

## 2018-04-26 NOTE — ED Triage Notes (Signed)
Pt states having difficulty keeping blood sugar down, was in 300s yesterday, took 4 units insulin  Without much change.  States awoke today feeling a little nauseous and weak.  Denies vomiting, ambulating with steady gait.

## 2018-04-26 NOTE — ED Provider Notes (Signed)
[facility] EMERGENCY DEPARTMENT Provider Note   CSN: [id number] Arrival date & time: 04/26/18  0854    History   Chief Complaint Chief Complaint  Patient presents with  . Nausea  . Hyperglycemia    HPI Sarahjane Matherly is a 28 y.o. adult.     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 Pt has h/o IDDM and was evaluated by PCP on 3/24, at which time diabetes medications were increased. Pt states he is compliant with medications but BG has been running high since yesterday. Was in 300s yesterday evening therefore he skipped dinner and also skipping novolog; he took lantus. This morning BG was still in 200s so he decided to come to ER. He has not had lantus this morning. He reports no major changes in diet recently. He endorses nausea, polyuria, polydipsia but no vomiting, diarrhea, dysuria, fevers, cough/URI symptoms.   The history is provided by the patient.  Hyperglycemia    Past Medical History:  Diagnosis Date  . Autism   . Diabetes mellitus without complication ([place])   . High cholesterol   . Hypertension     Patient Active Problem List   Diagnosis Date Noted  . Insulin dependent type 2 diabetes mellitus, uncontrolled ([place]) 04/06/2016  . High cholesterol 04/06/2016  . Hypertension 04/06/2016  . Endocrine disorder 04/06/2016  . Encounter for long-term current use of high risk medication 04/06/2016  . Obesity due to excess calories with serious comorbidity 04/06/2016  . Other mixed anxiety disorders 04/06/2016  . Depression 04/06/2016  . Female-to-female transgender person 04/06/2016  . PCOS (polycystic ovarian syndrome) 04/06/2016  . Vitamin D deficiency 04/06/2016    Past Surgical History:  Procedure Laterality Date  . ABDOMINAL HYSTERECTOMY       OB History   No obstetric history on file.      Home Medications    Prior to Admission medications   Medication Sig  Start Date End Date Taking? Authorizing Provider  ALPRAZolam Duanne Moron) 0.5 MG tablet Take 0.5 mg by mouth 2 (two) times daily as needed for anxiety or sleep.  09/14/16   [provider]  amLODipine (NORVASC) 10 MG tablet Take 10 mg by mouth daily. 09/13/16   [provider]  cephALEXin (KEFLEX) 500 MG capsule Take 1 capsule (500 mg total) by mouth 2 (two) times daily. 04/26/18   Regan Mcbryar, Wenda Overland, MD  cycloSPORINE (RESTASIS) 0.05 % ophthalmic emulsion Place 1 drop into both eyes 2 (two) times daily as needed (dry eyes).     [provider]  FLUoxetine (PROZAC) 20 MG tablet Take 20 mg by mouth daily.    [provider]  hydrochlorothiazide (HYDRODIURIL) 25 MG tablet Take 1 tablet (25 mg total) by mouth daily. 12/10/12   Reyne Dumas, MD  ibuprofen (ADVIL,MOTRIN) 800 MG tablet Take 1 tablet (800 mg total) by mouth every 8 (eight) hours as needed for mild pain. 03/28/17   Ward, Delice Bison, DO  insulin aspart (NOVOLOG) 100 UNIT/ML injection Inject 3-5 Units into the skin 3 (three) times daily before meals. Sliding scale    [provider]  insulin glargine (LANTUS) 100 UNIT/ML injection Inject 0.65 mLs (65 Units total) into the skin 2 (two) times daily. Patient taking differently: Inject 70 Units into the skin 2 (two) times daily.  07/07/16   Charlann Lange, PA-C  Multiple Vitamin (MULTIVITAMIN WITH MINERALS) TABS tablet Take 1 tablet by mouth daily.  [provider]  pantoprazole (PROTONIX) 20 MG tablet Take 20 mg by mouth daily after breakfast.     [provider]  potassium chloride (K-DUR) 10 MEQ tablet Take 1 tablet (10 mEq total) by mouth daily for 4 days. 08/10/17 12/13/17  Tegeler, Gwenyth Allegra, MD  prazosin (MINIPRESS) 2 MG capsule Take 2 mg by mouth at bedtime.    [provider]  testosterone cypionate (DEPO-TESTOSTERONE) 200 MG/ML injection Inject 0.25 mLs (50 mg total) into the muscle every 7 (seven) days. Patient taking  differently: Inject 100 mg into the muscle See admin instructions. Every 10 days 04/06/16   Shawnee Knapp, MD  traZODone (DESYREL) 100 MG tablet Take 100 mg by mouth at bedtime.     [provider]    Family History Family History  Problem Relation Age of Onset  . Hypertension Father   . Diabetes Father   . Diabetes Sister   . Diabetes Maternal Grandmother   . Breast cancer Maternal Grandmother   . Cancer Other   . Diabetes Other   . Breast cancer Paternal Grandmother     Social History Social History   Tobacco Use  . Smoking status: Former Research scientist (life sciences)  . Smokeless tobacco: Never Used  Substance Use Topics  . Alcohol use: Yes    Frequency: Never    Comment: Using past week and a half  . Drug use: No     Allergies   Gabapentin; Lactose intolerance (gi); Metformin and related; and Victoza [liraglutide]   Review of Systems Review of Systems All other systems reviewed and are negative except that which was mentioned in HPI   Physical Exam Updated Vital Signs BP 134/74 (BP Location: Left Arm)   Pulse 91   Temp (!) 97.5 F (36.4 C) (Oral)   Resp 20   Ht 5\' 1"  (1.549 m)   Wt 130.6 kg   SpO2 99%   BMI 54.42 kg/m   Physical Exam Vitals signs and nursing note reviewed.  Constitutional:      General: He is not in acute distress.    Appearance: He is well-developed.  HENT:     Head: Normocephalic and atraumatic.     Nose: Nose normal.     Mouth/Throat:     Mouth: Mucous membranes are moist.     Pharynx: Oropharynx is clear.  Eyes:     Conjunctiva/sclera: Conjunctivae normal.  Neck:     Musculoskeletal: Neck supple.  Cardiovascular:     Rate and Rhythm: Normal rate and regular rhythm.     Heart sounds: Normal heart sounds. No murmur.  Pulmonary:     Effort: Pulmonary effort is normal.     Breath sounds: Normal breath sounds.  Abdominal:     General: Bowel sounds are normal. There is no distension.     Palpations: Abdomen is soft.     Tenderness: There  is no abdominal tenderness.  Skin:    General: Skin is warm and dry.  Neurological:     Mental Status: He is alert and oriented to person, place, and time.     Comments: Fluent speech  Psychiatric:        Judgment: Judgment normal.      ED Treatments / Results  Labs (all labs ordered are listed, but only abnormal results are displayed) Labs Reviewed  COMPREHENSIVE METABOLIC PANEL - Abnormal; Notable for the following components:      Result Value   Glucose, Bld 251 (*)    All other  components within normal limits  CBC - Abnormal; Notable for the following components:   MCH 25.5 (*)    All other components within normal limits  URINALYSIS, ROUTINE W REFLEX MICROSCOPIC - Abnormal; Notable for the following components:   APPearance CLOUDY (*)    Specific Gravity, Urine >1.030 (*)    Glucose, UA >=500 (*)    Ketones, ur 15 (*)    Leukocytes,Ua MODERATE (*)    All other components within normal limits  URINALYSIS, MICROSCOPIC (REFLEX) - Abnormal; Notable for the following components:   Bacteria, UA MANY (*)    All other components within normal limits  CBG MONITORING, ED - Abnormal; Notable for the following components:   Glucose-Capillary 247 (*)    All other components within normal limits  CBG MONITORING, ED - Abnormal; Notable for the following components:   Glucose-Capillary 208 (*)    All other components within normal limits  URINE CULTURE    EKG None  Radiology No results found.  Procedures Procedures (including critical care time)  Medications Ordered in ED Medications  sodium chloride 0.9 % bolus 1,000 mL (0 mLs Intravenous Stopped 04/26/18 1037)  insulin glargine (LANTUS) injection 70 Units (70 Units Subcutaneous See Procedure Record 04/26/18 0948)     Initial Impression / Assessment and Plan / ED Course  I have reviewed the triage vital signs and the nursing notes.  Pertinent labs & imaging results that were available during my care of the patient were  reviewed by me and considered in my medical decision making (see chart for details).       Well appearing, reassuring VS. Labs show BG 250 with no evidence of DKA, normal AG and creatinine, normal CBC. UA does suggest infection w/ moderate leuks, large amount of WBC and bacteria. Some squamous cells, however given diabetes and hyperglycemia lately, it is possible that UTI is driving poor glucose control. Discussed treatment w/ keflex. Gave IVF bolus, repeat BG 208 and pt remains well appearing.  Instructed to continue medications w/ strict diabetic diet and increased water intake until Monday morning, when he contacts PCP for further instructions on adjusting medications. Extensively reviewed return precautions.  Final Clinical Impressions(s) / ED Diagnoses   Final diagnoses:  Hyperglycemia  Urinary tract infection without hematuria, site unspecified    ED Discharge Orders         Ordered    cephALEXin (KEFLEX) 500 MG capsule  2 times daily     04/26/18 1042           Kimbella Heisler, Wenda Overland, MD 04/26/18 1044

## 2018-04-27 LAB — URINE CULTURE: Culture: 50000 — AB

## 2018-06-16 IMAGING — CR DG HAND COMPLETE 3+V*L*
3 series · 3 of 3 positions shown · non-contrast
Comparison: None.

CLINICAL DATA: Status post fall 4 days ago, with worsening left
hand pain. Initial encounter.

EXAM:
LEFT HAND - COMPLETE 3+ VIEW

[x hand pa left]
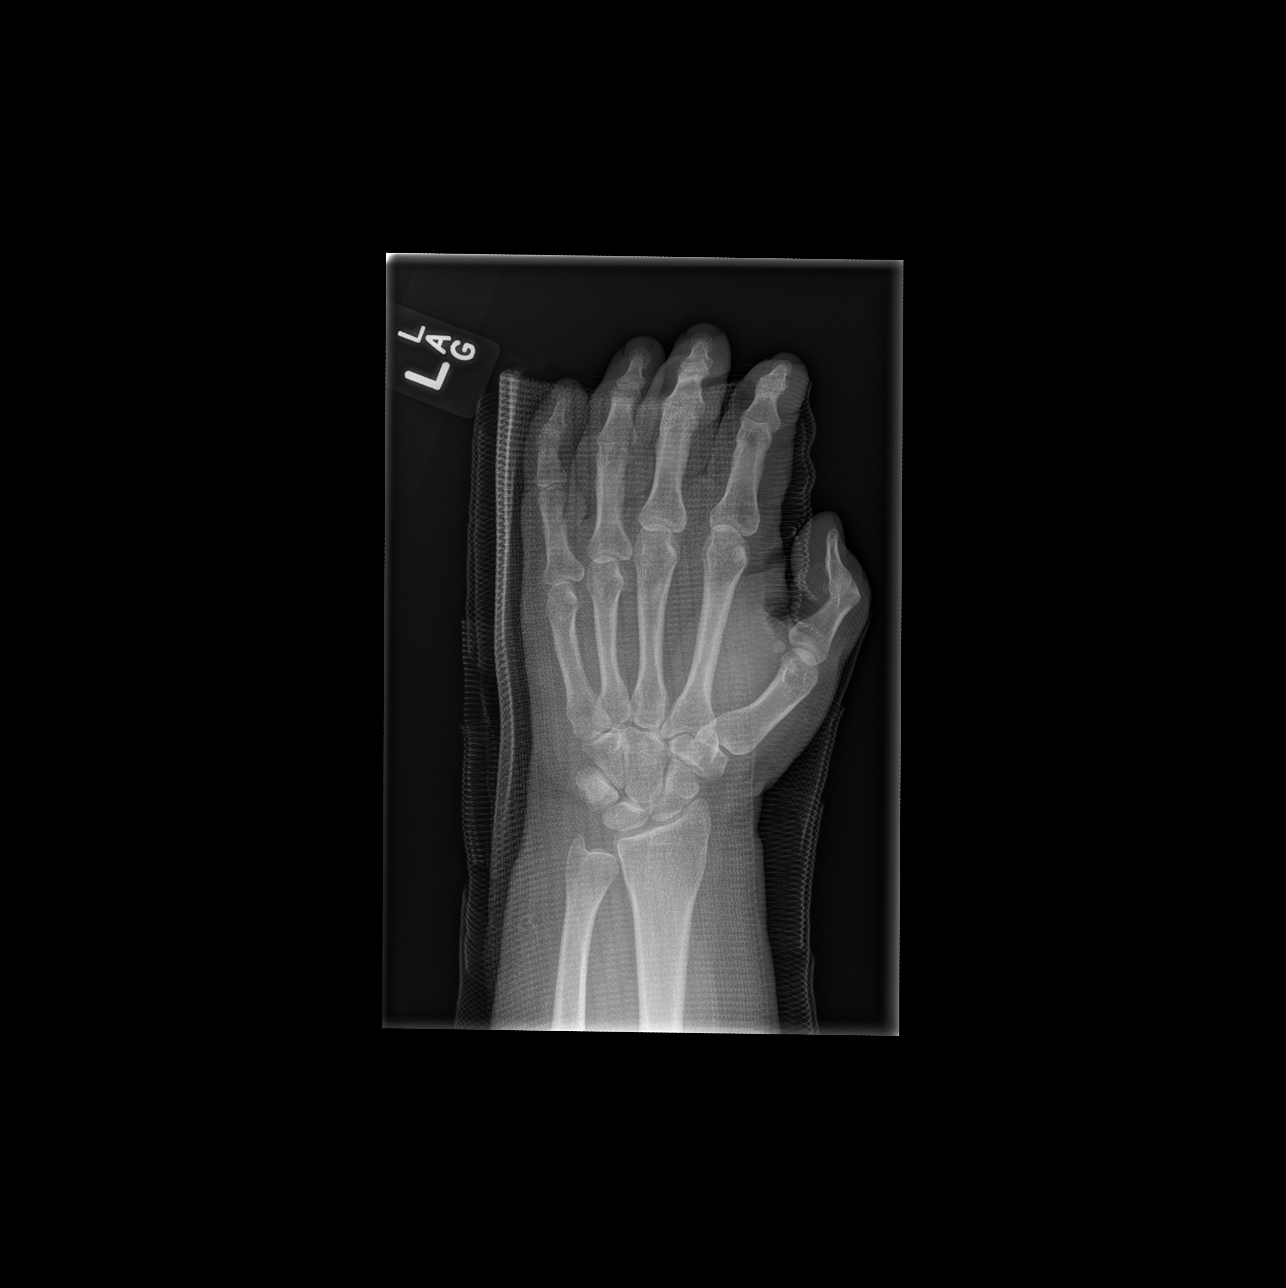

[x hand obl left]
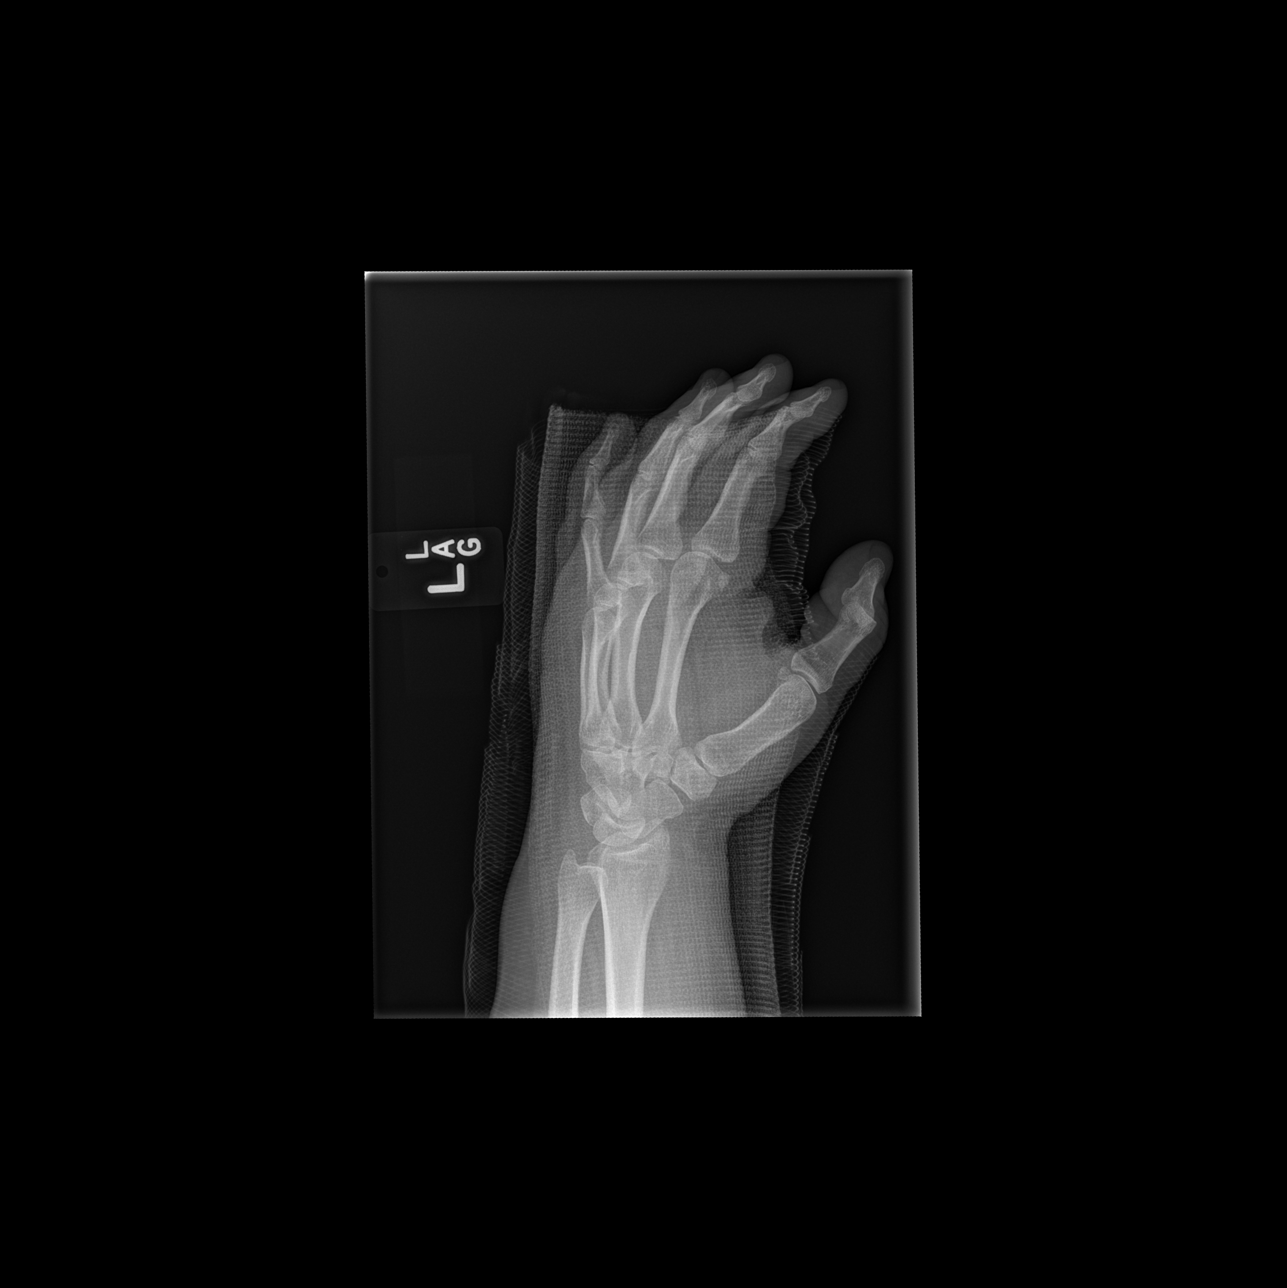

[x hand lat left]
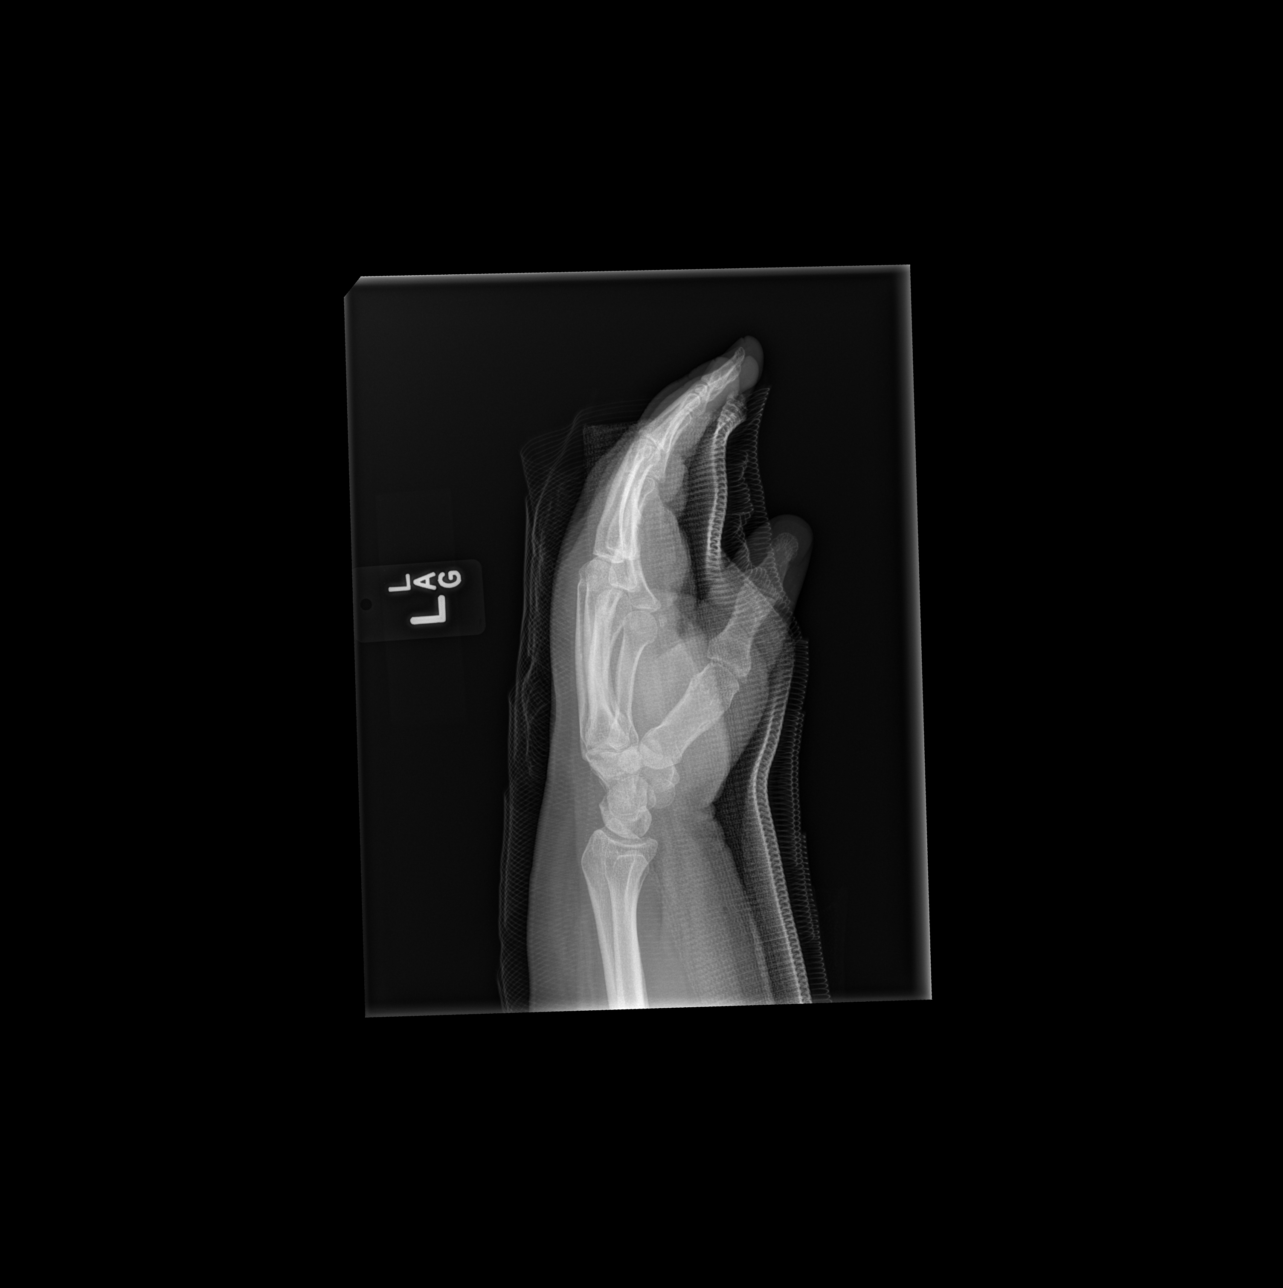

[3 of 3 positions shown; findings below may reference images not displayed]

FINDINGS: There is no evidence of fracture or dislocation. The joint spaces
are preserved. The questionable lucency at the waist of the scaphoid
is not well seen on hand radiographs. Negative ulnar variance is
noted.

The soft tissues are unremarkable in appearance.
IMPRESSION: No evidence of fracture or dislocation. Questionable lucency at the
waist of the scaphoid is not well seen on hand radiographs, though
as described on the wrist radiographs, a follow-up navicular view
without the splint would be helpful if clinically appropriate.

## 2018-06-16 IMAGING — CR DG WRIST COMPLETE 3+V*L*
4 series · 4 of 4 positions shown · non-contrast
Comparison: None.

CLINICAL DATA: Status post fall 4 days ago, with left wrist pain.
Initial encounter.

EXAM:
LEFT WRIST - COMPLETE 3+ VIEW

[x wrist lat left]
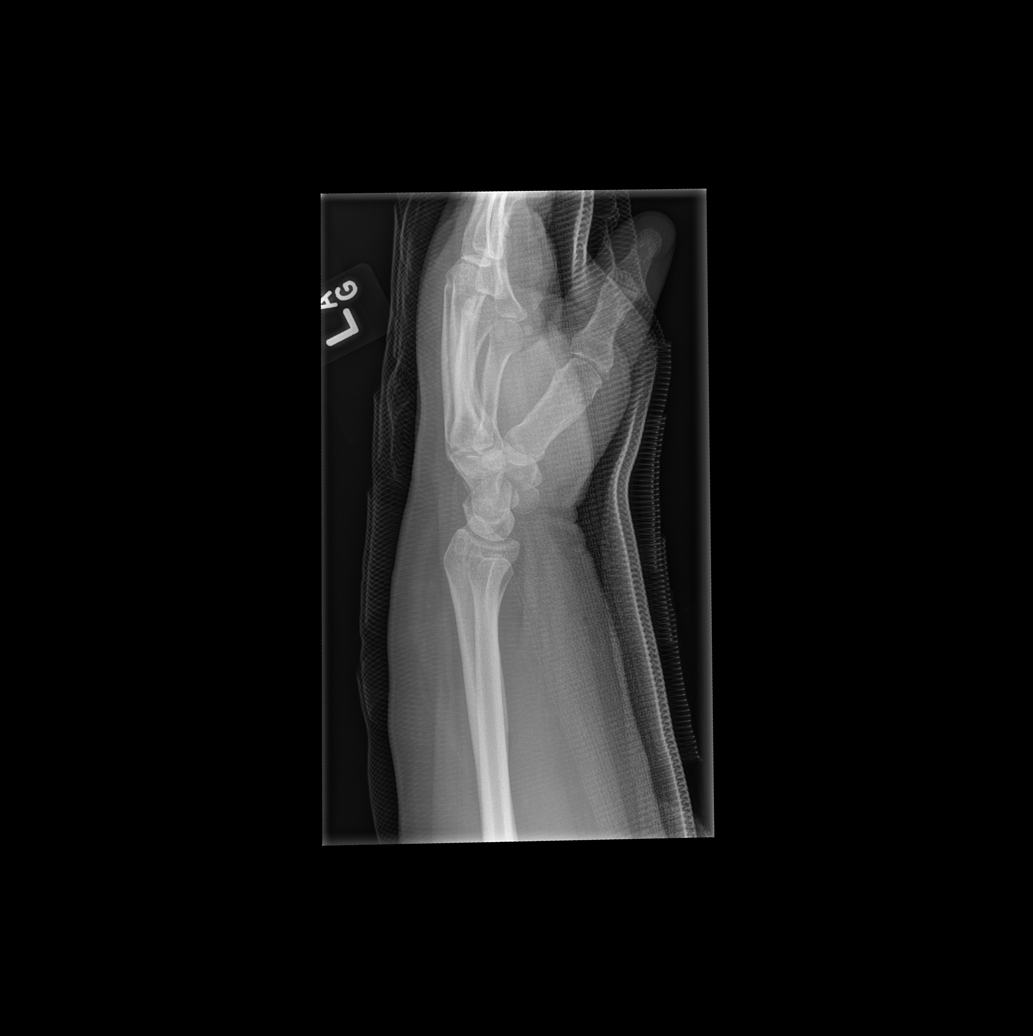

[x wrist pa left]
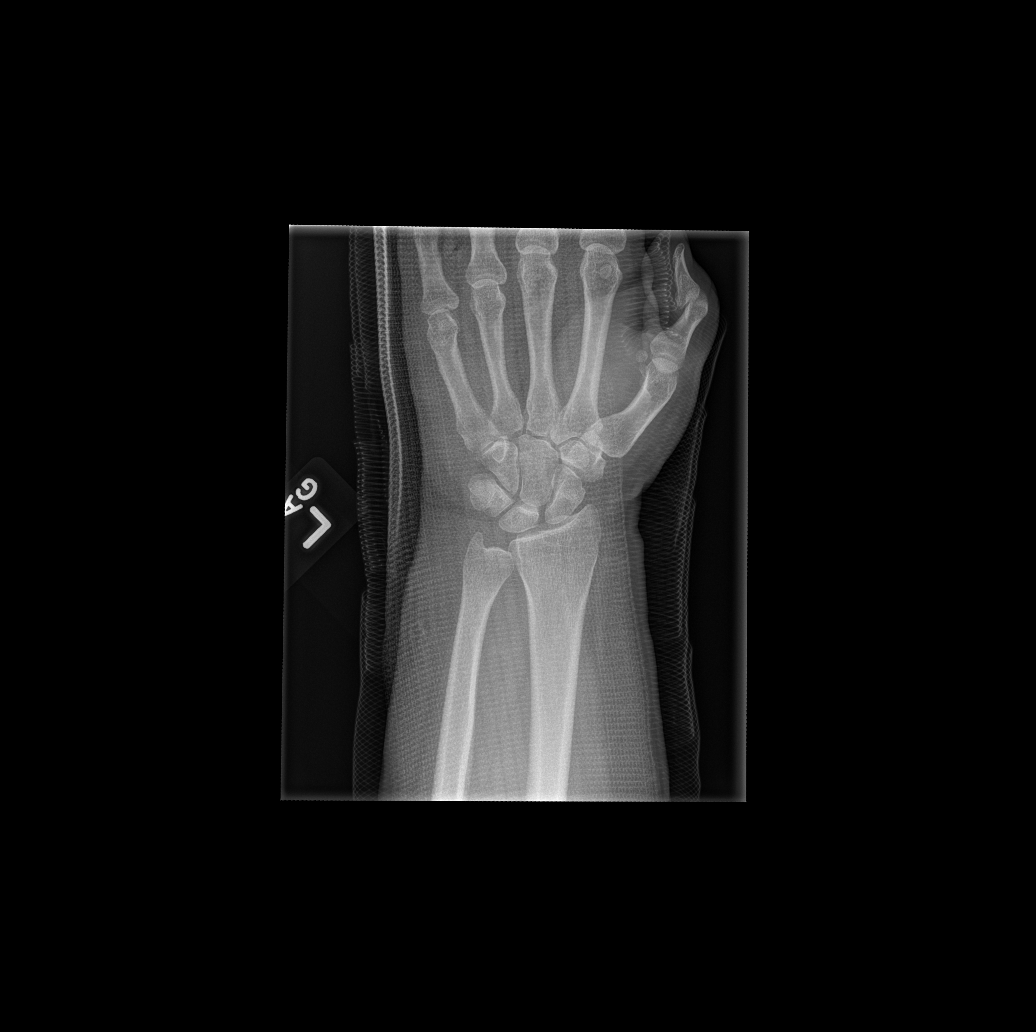

[x wrist obl left]
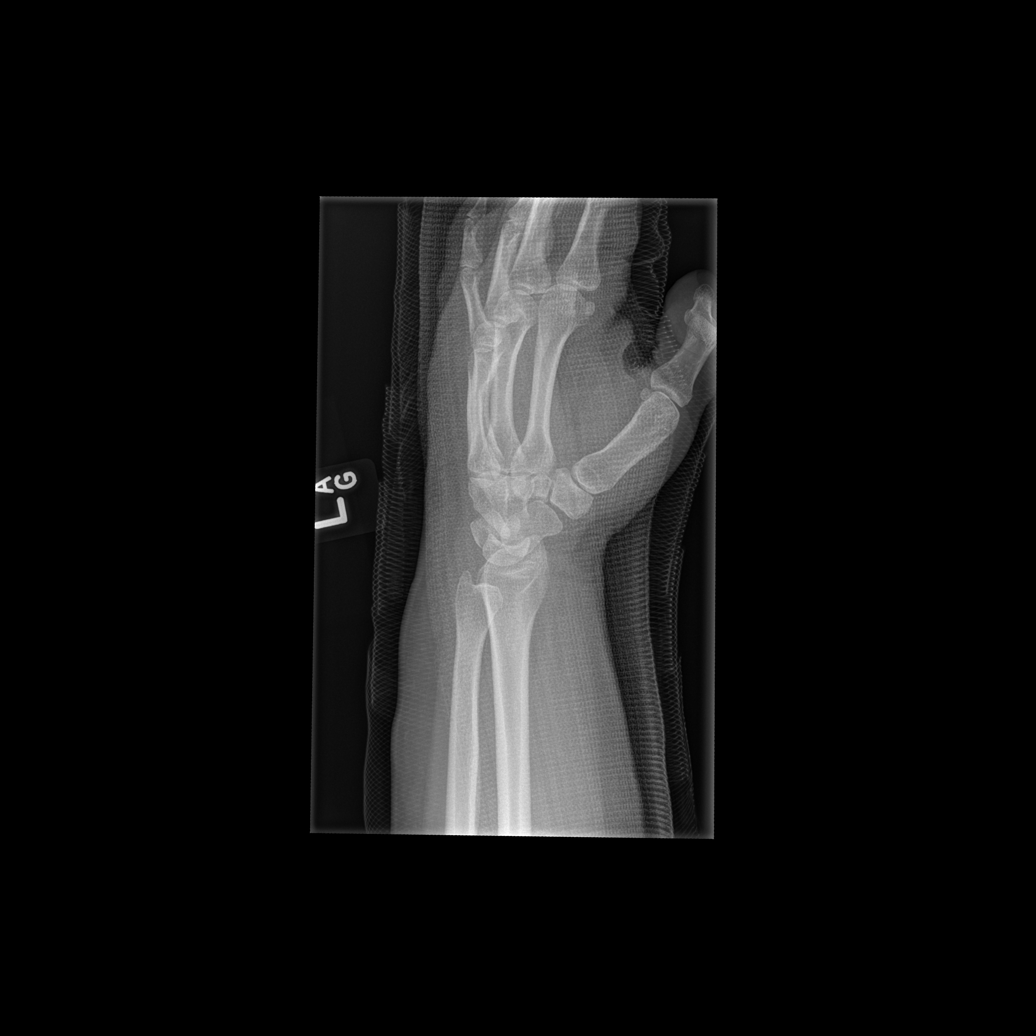

[x wrist navicular view left]
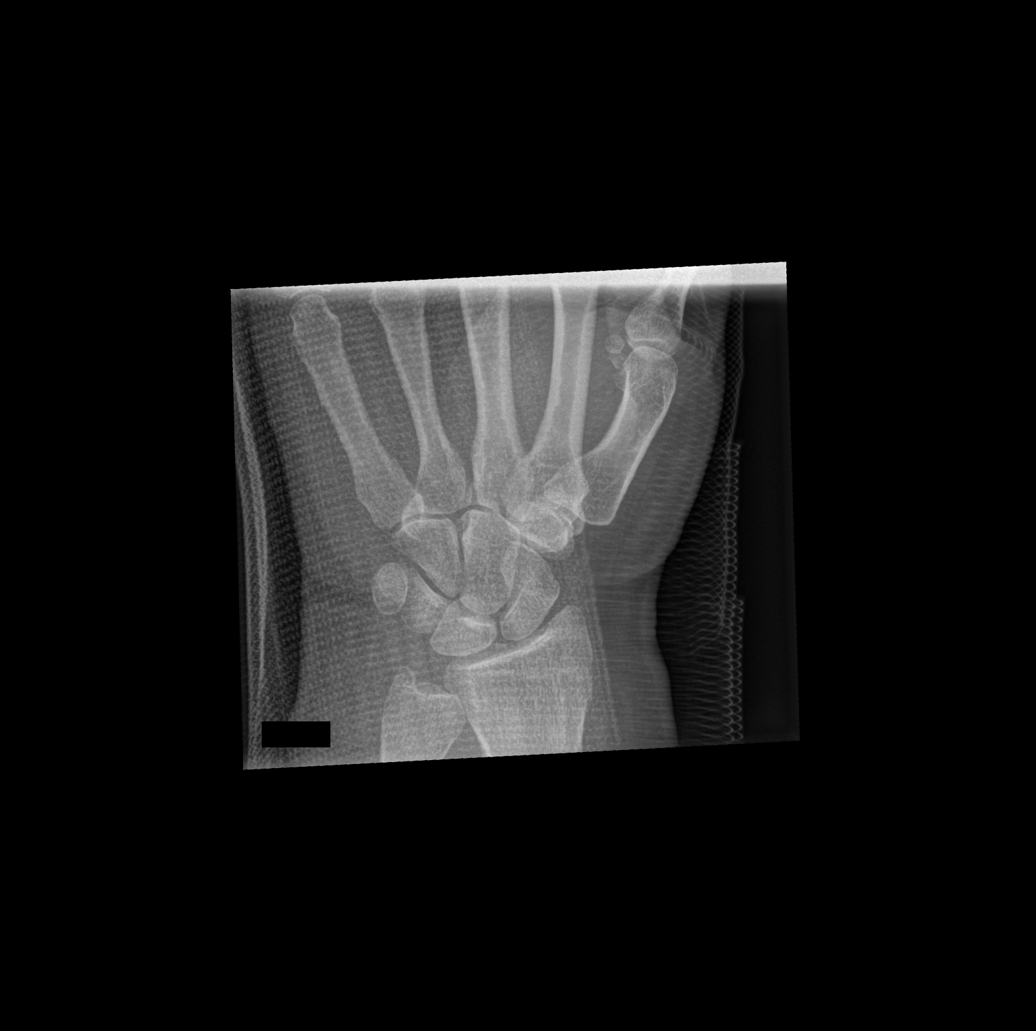

[4 of 4 positions shown; findings below may reference images not displayed]

FINDINGS: There is question of focal lucency along the waist of the scaphoid.
This would be better assessed on repeat navicular view without the
splint, if clinically appropriate.

The carpal rows are intact, and demonstrate normal alignment. The
joint spaces are preserved. Mild negative ulnar variance is noted.

No significant soft tissue abnormalities are seen.
IMPRESSION: Question of focal lucency along the waist of the scaphoid. This
would be better assessed on repeat navicular view without the
splint, if clinically appropriate. Would correlate for clinical
evidence of scaphoid fracture.

## 2018-07-02 ENCOUNTER — Emergency Department (HOSPITAL_BASED_OUTPATIENT_CLINIC_OR_DEPARTMENT_OTHER)
Admission: EM | Admit: 2018-07-02 | Discharge: 2018-07-02 | Disposition: A | Discharge status: Home / Self Care | Payer: Medicaid Other | Attending: Emergency Medicine | Admitting: Emergency Medicine

## 2018-07-02 ENCOUNTER — Encounter (HOSPITAL_BASED_OUTPATIENT_CLINIC_OR_DEPARTMENT_OTHER): Payer: Self-pay

## 2018-07-02 ENCOUNTER — Other Ambulatory Visit: Payer: Self-pay

## 2018-07-02 DIAGNOSIS — R1030 Lower abdominal pain, unspecified: Secondary | ICD-10-CM | POA: Diagnosis present

## 2018-07-02 DIAGNOSIS — Z87891 Personal history of nicotine dependence: Secondary | ICD-10-CM | POA: Diagnosis not present

## 2018-07-02 DIAGNOSIS — N760 Acute vaginitis: Secondary | ICD-10-CM | POA: Insufficient documentation

## 2018-07-02 DIAGNOSIS — N3000 Acute cystitis without hematuria: Secondary | ICD-10-CM | POA: Diagnosis not present

## 2018-07-02 DIAGNOSIS — B9689 Other specified bacterial agents as the cause of diseases classified elsewhere: Secondary | ICD-10-CM

## 2018-07-02 DIAGNOSIS — Z794 Long term (current) use of insulin: Secondary | ICD-10-CM | POA: Diagnosis not present

## 2018-07-02 DIAGNOSIS — I10 Essential (primary) hypertension: Secondary | ICD-10-CM | POA: Diagnosis not present

## 2018-07-02 DIAGNOSIS — Z79899 Other long term (current) drug therapy: Secondary | ICD-10-CM | POA: Diagnosis not present

## 2018-07-02 DIAGNOSIS — E119 Type 2 diabetes mellitus without complications: Secondary | ICD-10-CM | POA: Insufficient documentation

## 2018-07-02 HISTORY — DX: Malignant neoplasm of uterus, part unspecified: C55

## 2018-07-02 LAB — WET PREP, GENITAL
Sperm: NONE SEEN
Trich, Wet Prep: NONE SEEN
Yeast Wet Prep HPF POC: NONE SEEN

## 2018-07-02 LAB — COMPREHENSIVE METABOLIC PANEL
ALT: 34 U/L (ref 0–44)
AST: 21 U/L (ref 15–41)
Albumin: 4.2 g/dL (ref 3.5–5.0)
Alkaline Phosphatase: 67 U/L (ref 38–126)
Anion gap: 9 (ref 5–15)
BUN: 9 mg/dL (ref 6–20)
CO2: 27 mmol/L (ref 22–32)
Calcium: 9 mg/dL (ref 8.9–10.3)
Chloride: 102 mmol/L (ref 98–111)
Creatinine, Ser: 0.61 mg/dL (ref 0.61–1.24)
GFR calc Af Amer: >60 mL/min (ref >60)
GFR calc non Af Amer: >60 mL/min (ref >60)
Glucose, Bld: 159 mg/dL — ABNORMAL HIGH (ref 70–99)
Potassium: 3.8 mmol/L (ref 3.5–5.1)
Sodium: 138 mmol/L (ref 135–145)
Total Bilirubin: 0.5 mg/dL (ref 0.3–1.2)
Total Protein: 7.5 g/dL (ref 6.5–8.1)

## 2018-07-02 LAB — CBC WITH DIFFERENTIAL/PLATELET
Abs Immature Granulocytes: 0.06 10*3/uL (ref 0.00–0.07)
Basophils Absolute: 0 10*3/uL (ref 0.0–0.1)
Basophils Relative: 0 %
Eosinophils Absolute: 0.2 10*3/uL (ref 0.0–0.5)
Eosinophils Relative: 2 %
HCT: 47.8 % (ref 39.0–52.0)
Hemoglobin: 14.7 g/dL (ref 13.0–17.0)
Immature Granulocytes: 1 %
Lymphocytes Relative: 20 %
Lymphs Abs: 2.1 10*3/uL (ref 0.7–4.0)
MCH: 25.3 pg — ABNORMAL LOW (ref 26.0–34.0)
MCHC: 30.8 g/dL (ref 30.0–36.0)
MCV: 82.4 fL (ref 80.0–100.0)
Monocytes Absolute: 0.6 10*3/uL (ref 0.1–1.0)
Monocytes Relative: 6 %
Neutro Abs: 7.6 10*3/uL (ref 1.7–7.7)
Neutrophils Relative %: 71 %
Platelets: 291 10*3/uL (ref 150–400)
RBC: 5.8 MIL/uL (ref 4.22–5.81)
RDW: 16 % — ABNORMAL HIGH (ref 11.5–15.5)
WBC: 10.6 10*3/uL — ABNORMAL HIGH (ref 4.0–10.5)
nRBC: 0 % (ref 0.0–0.2)

## 2018-07-02 LAB — URINALYSIS, ROUTINE W REFLEX MICROSCOPIC
Bilirubin Urine: NEGATIVE
Glucose, UA: NEGATIVE mg/dL
Hgb urine dipstick: NEGATIVE
Ketones, ur: NEGATIVE mg/dL
Nitrite: NEGATIVE
Protein, ur: NEGATIVE mg/dL
Specific Gravity, Urine: 1.025 (ref 1.005–1.030)
pH: 7 (ref 5.0–8.0)

## 2018-07-02 LAB — URINALYSIS, MICROSCOPIC (REFLEX)

## 2018-07-02 LAB — LIPASE, BLOOD: Lipase: 41 U/L (ref 11–51)

## 2018-07-02 MED ORDER — METRONIDAZOLE 500 MG PO TABS: 500.0000 mg | ORAL_TABLET | Freq: Two times a day (BID) | ORAL | 0 refills | Status: AC

## 2018-07-02 MED ORDER — CEPHALEXIN 500 MG PO CAPS: 500.0000 mg | ORAL_CAPSULE | Freq: Two times a day (BID) | ORAL | 0 refills | Status: AC

## 2018-07-02 MED ORDER — ONDANSETRON HCL 4 MG/2ML IJ SOLN
4.0000 mg | Freq: Once | INTRAMUSCULAR | Status: AC
  Administered 2018-07-02: 4 mg via INTRAVENOUS
  Filled 2018-07-02: qty 2

## 2018-07-02 MED ORDER — MORPHINE SULFATE (PF) 4 MG/ML IV SOLN
4.0000 mg | Freq: Once | INTRAVENOUS | Status: AC
  Administered 2018-07-02: 4 mg via INTRAVENOUS
  Filled 2018-07-02: qty 1

## 2018-07-02 NOTE — ED Triage Notes (Signed)
c/o lower abd pain x 1 week-NAD-steady gait

## 2018-07-02 NOTE — Discharge Instructions (Addendum)
You have been seen in the Emergency Department (ED) today for pain when urinating.  Your workup today suggests that you have a urinary tract infection (UTI).  -Prescription was also sent to your pharmacy for Flagyl.  This is an antibiotic for bacterial vaginosis.  Please take as prescribed.  Do not drink alcohol while taking.  Please take your antibiotic as prescribed and over-the-counter pain medication (Tylenol or Motrin) as needed, but no more than recommended on the label instructions.  Drink PLENTY of fluids.  Call your regular doctor to schedule the next available appointment to follow up on todays ED visit, or return immediately to the ED if your pain worsens, you have decreased urine production, develop fever, persistent vomiting, or other symptoms that concern you.

## 2018-07-02 NOTE — ED Provider Notes (Signed)
[facility] EMERGENCY DEPARTMENT Provider Note   CSN: [id number] Arrival date & time: 07/02/18  1227    History   Chief Complaint Chief Complaint  Patient presents with  . Abdominal Pain    HPI Krista Williams is a 28 y.o. 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  Patient states he has had lower abdominal cramping.  Pain has progressively gotten worse.  He rates  8 out of 10 in severity.  He reports associated nausea without emesis.  Also reports thin white vaginal discharge. He has not taken anything for pain prior to arrival.  He reports compliance with his medications, states blood sugars have ranging 150-180. denies fever, chills, chest pain, shortness of breath, hematuria, urinary frequency, dysuria, polyuria, diarrhea, pelvic pain. Pt has not been sexually active in several months, he is not concerned about STIs.  History provided by patient with additional history obtained from chart review.      Past Medical History:  Diagnosis Date  . Autism   . Diabetes mellitus without complication ([place])   . High cholesterol   . Hypertension   . Uterine cancer [facility])     Patient Active Problem List   Diagnosis Date Noted  . Insulin dependent type 2 diabetes mellitus, uncontrolled ([place]) 04/06/2016  . High cholesterol 04/06/2016  . Hypertension 04/06/2016  . Endocrine disorder 04/06/2016  . Encounter for long-term current use of high risk medication 04/06/2016  . Obesity due to excess calories with serious comorbidity 04/06/2016  . Other mixed anxiety disorders 04/06/2016  . Depression 04/06/2016  . Female-to-female transgender person 04/06/2016  . PCOS (polycystic ovarian syndrome) 04/06/2016  . Vitamin D deficiency 04/06/2016    Past Surgical History:  Procedure  Laterality Date  . ABDOMINAL HYSTERECTOMY       OB History   No obstetric history on file.      Home Medications    Prior to Admission medications   Medication Sig Start Date End Date Taking? Authorizing Provider  atorvastatin (LIPITOR) 20 MG tablet atorvastatin 20 mg tablet 04/15/18  Yes [provider]  Brexpiprazole (REXULTI) 2 MG TABS Rexulti 2 mg tablet  TK 1 T PO QAM 02/13/18  Yes [provider]  Insulin Degludec (TRESIBA FLEXTOUCH) 200 UNIT/ML SOPN Tresiba FlexTouch U-200 insulin 200 unit/mL (3 mL) subcutaneous pen 05/22/18  Yes [provider]  lamoTRIgine (LAMICTAL) 100 MG tablet lamotrigine 100 mg tablet  TK 1 T PO Q NIGHT 11/06/17  Yes [provider]  liraglutide (VICTOZA) 18 MG/3ML SOPN Victoza 2-Pak 0.6 mg/0.1 mL (18 mg/3 mL) subcutaneous pen injector  ADM 1.2 MG [name] 01/28/18  Yes [provider]  OLANZapine (ZYPREXA) 15 MG tablet TK 1 T PO Q NIGHT 12/16/17  Yes [provider]  ALPRAZolam (XANAX) 0.5 MG tablet Take 0.5 mg by mouth 2 (two) times daily as needed for anxiety or sleep.  09/14/16   [provider]  cephALEXin (KEFLEX) 500 MG capsule Take 1 capsule (500 mg total) by mouth 2 (two) times daily for 5 days. 07/02/18 07/07/18  Albrizze, Kaitlyn E, PA-C  cycloSPORINE (RESTASIS) 0.05 % ophthalmic emulsion Place 1 drop into both eyes 2 (two) times daily as needed (dry eyes).     [provider]  FLUoxetine (PROZAC) 20 MG tablet Take 20 mg by mouth daily.    [provider]  hydrochlorothiazide (HYDRODIURIL) 25 MG tablet Take 1 tablet (25 mg total) by mouth daily. 12/10/12   Reyne Dumas, MD  ibuprofen (ADVIL,MOTRIN) 800 MG tablet Take 1 tablet (800 mg total) by mouth every 8 (eight) hours as needed for mild pain. 03/28/17   Ward, Delice Bison, DO  insulin aspart (NOVOLOG) 100 UNIT/ML injection Inject 3-5 Units into the skin 3 (three) times daily before meals. Sliding scale    [provider]   metroNIDAZOLE (FLAGYL) 500 MG tablet Take 1 tablet (500 mg total) by mouth 2 (two) times daily. 07/02/18   Albrizze, Kaitlyn E, PA-C  Multiple Vitamin (MULTIVITAMIN WITH MINERALS) TABS tablet Take 1 tablet by mouth daily.    [provider]  pantoprazole (PROTONIX) 20 MG tablet Take 20 mg by mouth daily after breakfast.     [provider]  potassium chloride (K-DUR) 10 MEQ tablet Take 1 tablet (10 mEq total) by mouth daily for 4 days. 08/10/17 12/13/17  Tegeler, Gwenyth Allegra, MD  prazosin (MINIPRESS) 2 MG capsule Take 2 mg by mouth at bedtime.    [provider]  sertraline (ZOLOFT) 100 MG tablet TK 1 T PO QAM 05/22/18   [provider]  testosterone cypionate (DEPO-TESTOSTERONE) 200 MG/ML injection Inject 0.25 mLs (50 mg total) into the muscle every 7 (seven) days. Patient taking differently: Inject 100 mg into the muscle See admin instructions. Every 10 days 04/06/16   Shawnee Knapp, MD  traZODone (DESYREL) 100 MG tablet Take 100 mg by mouth at bedtime.     [provider]    Family History Family History  Problem Relation Age of Onset  . Hypertension Father   . Diabetes Father   . Diabetes Sister   . Diabetes Maternal Grandmother   . Breast cancer Maternal Grandmother   . Cancer Other   . Diabetes Other   . Breast cancer Paternal Grandmother     Social History Social History   Tobacco Use  . Smoking status: Former Research scientist (life sciences)  . Smokeless tobacco: Never Used  Substance Use Topics  . Alcohol use: Yes    Frequency: Never    Comment: occ  . Drug use: No     Allergies   Gabapentin; Lactose intolerance (gi); Metformin and related; and Victoza [liraglutide]   Review of Systems Review of Systems  Constitutional: Negative for chills and fever.  HENT: Negative for congestion, rhinorrhea, sinus pressure and sore throat.   Eyes: Negative for pain and redness.  Respiratory: Negative for cough, shortness of breath and wheezing.    Cardiovascular: Negative for chest pain and palpitations.  Gastrointestinal: Positive for abdominal pain and nausea. Negative for constipation, diarrhea and vomiting.  Genitourinary: Negative for dysuria, flank pain, hematuria and urgency.  Musculoskeletal: Negative for arthralgias, back pain, myalgias and neck pain.  Skin: Negative for rash and wound.  Neurological: Negative for dizziness, syncope, weakness, numbness and headaches.  Psychiatric/Behavioral: Negative for confusion.     Physical Exam Updated Vital Signs BP 132/76   Pulse 74   Temp 98.3 F (36.8 C) (Oral)   Resp 17   Ht 5\' 1"  (1.549 m)   Wt 133.8 kg   SpO2 100%   BMI 55.74 kg/m   Physical Exam Vitals signs and nursing note reviewed.  Constitutional:      General: He is not in acute distress.    Appearance: He is not ill-appearing.  HENT:     Head: Normocephalic and atraumatic.     Right Ear: Tympanic  membrane and external ear normal.     Left Ear: Tympanic membrane and external ear normal.     Nose: Nose normal.     Mouth/Throat:     Mouth: Mucous membranes are moist.     Pharynx: Oropharynx is clear.  Eyes:     General: No scleral icterus.       Right eye: No discharge.        Left eye: No discharge.     Extraocular Movements: Extraocular movements intact.     Conjunctiva/sclera: Conjunctivae normal.     Pupils: Pupils are equal, round, and reactive to light.  Neck:     Musculoskeletal: Normal range of motion.     Vascular: No JVD.  Cardiovascular:     Rate and Rhythm: Normal rate and regular rhythm.     Pulses: Normal pulses.          Radial pulses are 2+ on the right side and 2+ on the left side.     Heart sounds: Normal heart sounds.  Pulmonary:     Comments: Lungs clear to auscultation in all fields. Symmetric chest rise. No wheezing, rales, or rhonchi. Abdominal:     Tenderness: There is no right CVA tenderness or left CVA tenderness.     Comments: Abdomen is soft, non-distended.  Tenderness  to palpation of right and left lower quadrants. No rigidity, no guarding. No peritoneal signs.  Genitourinary:    Comments: Normal external genitalia. No pain with speculum insertion. Closed cervical os with normal appearance - no rash or lesions. No significant discharge or bleeding noted from cervix or in vaginal vault. On bimanual examination no adnexal tenderness or cervical motion tenderness. Chaperone Rolene Arbour present during exam.  Musculoskeletal: Normal range of motion.  Skin:    General: Skin is warm and dry.     Capillary Refill: Capillary refill takes less than 2 seconds.  Neurological:     Mental Status: He is oriented to person, place, and time.     GCS: GCS eye subscore is 4. GCS verbal subscore is 5. GCS motor subscore is 6.     Comments: Fluent speech, no facial droop.  Psychiatric:        Behavior: Behavior normal.      ED Treatments / Results  Labs (all labs ordered are listed, but only abnormal results are displayed) Labs Reviewed  WET PREP, GENITAL - Abnormal; Notable for the following components:      Result Value   Clue Cells Wet Prep HPF POC PRESENT (*)    WBC, Wet Prep HPF POC MANY (*)    All other components within normal limits  URINALYSIS, ROUTINE W REFLEX MICROSCOPIC - Abnormal; Notable for the following components:   APPearance HAZY (*)    Leukocytes,Ua SMALL (*)    All other components within normal limits  URINALYSIS, MICROSCOPIC (REFLEX) - Abnormal; Notable for the following components:   Bacteria, UA MANY (*)    All other components within normal limits  COMPREHENSIVE METABOLIC PANEL - Abnormal; Notable for the following components:   Glucose, Bld 159 (*)    All other components within normal limits  CBC WITH DIFFERENTIAL/PLATELET - Abnormal; Notable for the following components:   WBC 10.6 (*)    MCH 25.3 (*)    RDW 16.0 (*)    All other components within normal limits  URINE CULTURE  LIPASE, BLOOD  RPR  HIV ANTIBODY (ROUTINE TESTING W  REFLEX)  GC/CHLAMYDIA PROBE AMP ([place]) NOT AT  [facility]    EKG None  Radiology No results found.  Procedures Procedures (including critical care time)  Medications Ordered in ED Medications  morphine 4 MG/ML injection 4 mg (4 mg Intravenous Given 07/02/18 1351)  ondansetron (ZOFRAN) injection 4 mg (4 mg Intravenous Given 07/02/18 1350)     Initial Impression / Assessment and Plan / ED Course  I have reviewed the triage vital signs and the nursing notes.  Pertinent labs & imaging results that were available during my care of the patient were reviewed by me and considered in my medical decision making (see chart for details).   Pt presents with abdominal pain x 1 week.  He is afebrile, nonseptic appearing.  On exam he has tenderness toright and left lower quadrants, no cva tenderness. UA with signs of UTI including small leukocytes, WBC 21-50, many bacteria. Culture sent.  Chart review shows patient had UTI in 04/2018.  The urine culture at that time grew many species and no reflect was suggested. CBC overall unremarkable.  CMP with glucose of 159, normal anion gap, DKA unlikely. Lipase within normal range. On reassessment pt is pain and nausea free after IV morphine and zofran. Abdomen is benign, doubt surgical abdomen. Wet prep shows clue cells and many WBC.  Given patient has vaginal discharge and findings suggestive of BV will discharge home prescription for Flagyl. Also prescription for Keflex for UTI.  Patient is tolerating p.o. intake while in the department.  Patient declines prophylactic treatment for STIs as he is not concerned.  He is aware of test results are positive you will have to inform partners and be treated himself. Patient is hemodynamically stable, in NAD at discharge/ Evaluation does not show pathology that would require ongoing emergent intervention or inpatient treatment. I explained the diagnosis to the patient.  Patient is comfortable with above plan and is  stable for discharge at this time. All questions were answered prior to disposition. Strict return precautions for returning to the ED were discussed. Encouraged follow up with PCP.   This note was prepared using Dragon voice recognition software and may include unintentional dictation errors due to the inherent limitations of voice recognition software.     Final Clinical Impressions(s) / ED Diagnoses   Final diagnoses:  Acute cystitis without hematuria  Bacterial vaginosis    ED Discharge Orders         Ordered    metroNIDAZOLE (FLAGYL) 500 MG tablet  2 times daily     07/02/18 1531    cephALEXin (KEFLEX) 500 MG capsule  2 times daily     07/02/18 1531           Albrizze, Harley Hallmark, PA-C 07/02/18 1546    Lennice Sites, DO 07/03/18 1055

## 2018-07-02 NOTE — ED Notes (Signed)
ED Provider at bedside. 

## 2018-07-03 LAB — HIV ANTIBODY (ROUTINE TESTING W REFLEX): HIV Screen 4th Generation wRfx: NONREACTIVE

## 2018-07-03 LAB — URINE CULTURE

## 2018-07-03 LAB — GC/CHLAMYDIA PROBE AMP (~~LOC~~) NOT AT ARMC
Chlamydia: NEGATIVE
Neisseria Gonorrhea: NEGATIVE

## 2018-07-03 LAB — RPR: RPR Ser Ql: NONREACTIVE

## 2018-07-18 ENCOUNTER — Other Ambulatory Visit: Payer: Self-pay

## 2018-07-18 ENCOUNTER — Encounter (HOSPITAL_BASED_OUTPATIENT_CLINIC_OR_DEPARTMENT_OTHER): Payer: Self-pay | Admitting: *Deleted

## 2018-07-18 ENCOUNTER — Emergency Department (HOSPITAL_BASED_OUTPATIENT_CLINIC_OR_DEPARTMENT_OTHER)
Admission: EM | Admit: 2018-07-18 | Discharge: 2018-07-18 | Disposition: A | Discharge status: Home / Self Care | Payer: Medicaid Other | Attending: Emergency Medicine | Admitting: Emergency Medicine

## 2018-07-18 DIAGNOSIS — Z09 Encounter for follow-up examination after completed treatment for conditions other than malignant neoplasm: Secondary | ICD-10-CM | POA: Diagnosis present

## 2018-07-18 DIAGNOSIS — Z794 Long term (current) use of insulin: Secondary | ICD-10-CM | POA: Diagnosis not present

## 2018-07-18 DIAGNOSIS — F640 Transsexualism: Secondary | ICD-10-CM | POA: Insufficient documentation

## 2018-07-18 DIAGNOSIS — Z87891 Personal history of nicotine dependence: Secondary | ICD-10-CM | POA: Insufficient documentation

## 2018-07-18 DIAGNOSIS — E119 Type 2 diabetes mellitus without complications: Secondary | ICD-10-CM | POA: Insufficient documentation

## 2018-07-18 DIAGNOSIS — Z79899 Other long term (current) drug therapy: Secondary | ICD-10-CM | POA: Insufficient documentation

## 2018-07-18 DIAGNOSIS — I10 Essential (primary) hypertension: Secondary | ICD-10-CM | POA: Insufficient documentation

## 2018-07-18 DIAGNOSIS — F840 Autistic disorder: Secondary | ICD-10-CM | POA: Insufficient documentation

## 2018-07-18 DIAGNOSIS — Z711 Person with feared health complaint in whom no diagnosis is made: Secondary | ICD-10-CM

## 2018-07-18 LAB — URINALYSIS, ROUTINE W REFLEX MICROSCOPIC
Bilirubin Urine: NEGATIVE
Glucose, UA: 250 mg/dL — AB
Hgb urine dipstick: NEGATIVE
Ketones, ur: NEGATIVE mg/dL
Leukocytes,Ua: NEGATIVE
Nitrite: NEGATIVE
Protein, ur: NEGATIVE mg/dL
Specific Gravity, Urine: >1.03 — ABNORMAL HIGH (ref 1.005–1.030)
pH: 6 (ref 5.0–8.0)

## 2018-07-18 NOTE — Discharge Instructions (Addendum)
You no longer have a urinary tract infection.

## 2018-07-18 NOTE — ED Triage Notes (Signed)
Patient had UTI and took his last antibiotic med this past Sunday and would like to know if he still had the UTI.

## 2018-07-20 NOTE — ED Provider Notes (Signed)
[facility] EMERGENCY DEPARTMENT Provider Note   CSN: [id number] Arrival date & time: 07/18/18  5956     History   Chief Complaint Chief Complaint  Patient presents with  . Follow-up    HPI Krista Williams is a 28 y.o. adult.     HPI   28 year old female to female presenting wanting to know if his urinary tract infection is cleared.  Recent seen in the emergency room and diagnosed with a UTI.  Did complete antibiotics.  No further urinary complaints.  Past Medical History:  Diagnosis Date  . Autism   . Diabetes mellitus without complication ([place])   . High cholesterol   . Hypertension   . Uterine cancer [facility])     Patient Active Problem List   Diagnosis Date Noted  . Insulin dependent type 2 diabetes mellitus, uncontrolled ([place]) 04/06/2016  . High cholesterol 04/06/2016  . Hypertension 04/06/2016  . Endocrine disorder 04/06/2016  . Encounter for long-term current use of high risk medication 04/06/2016  . Obesity due to excess calories with serious comorbidity 04/06/2016  . Other mixed anxiety disorders 04/06/2016  . Depression 04/06/2016  . Female-to-female transgender person 04/06/2016  . PCOS (polycystic ovarian syndrome) 04/06/2016  . Vitamin D deficiency 04/06/2016    Past Surgical History:  Procedure Laterality Date  . ABDOMINAL HYSTERECTOMY       OB History   No obstetric history on file.      Home Medications    Prior to Admission medications   Medication Sig Start Date End Date Taking? Authorizing Provider  hydrochlorothiazide (HYDRODIURIL) 25 MG tablet Take by mouth. 12/10/12  Yes [provider]  liraglutide (VICTOZA) 18 MG/3ML SOPN Inject into the skin. 04/15/18  Yes [provider]  ALPRAZolam Duanne Moron) 0.5 MG tablet Take 0.5 mg by mouth 2 (two) times daily as needed for anxiety or sleep.  09/14/16   [provider]  Brexpiprazole (REXULTI) 2 MG TABS Rexulti 2 mg tablet  TK 1 T PO QAM 02/13/18   [provider]  cycloSPORINE (RESTASIS) 0.05 % ophthalmic emulsion Place 1 drop into both eyes 2 (two) times daily as needed (dry eyes).     [provider]  cycloSPORINE (RESTASIS) 0.05 % ophthalmic emulsion Restasis 0.05 % eye drops in a dropperette  INT 1 GTT IN OU BID    [provider]  FLUoxetine (PROZAC) 20 MG tablet Take 20 mg by mouth daily.    [provider]  insulin aspart (NOVOLOG) 100 UNIT/ML injection Inject 3-5 Units into the skin 3 (three) times daily before meals. Sliding scale    [provider]  Insulin Degludec (TRESIBA FLEXTOUCH) 200 UNIT/ML SOPN Tresiba FlexTouch U-200 insulin 200 unit/mL (3 mL) subcutaneous pen 05/22/18   [provider]  lamoTRIgine (LAMICTAL) 100 MG tablet lamotrigine 100 mg tablet  TK 1 T PO Q NIGHT 11/06/17   [provider]  liraglutide (VICTOZA) 18 MG/3ML SOPN Victoza 2-Pak 0.6 mg/0.1 mL (18 mg/3 mL) subcutaneous pen injector  ADM 1.2 MG [place] D 01/28/18   [provider]  metroNIDAZOLE (FLAGYL) 500 MG tablet Take 1 tablet (500 mg total) by mouth 2 (two) times daily. 07/02/18   Albrizze, Kaitlyn E, PA-C  Multiple Vitamin (MULTIVITAMIN WITH MINERALS) TABS tablet Take 1 tablet by mouth daily.    [provider]  OLANZapine (ZYPREXA) 15 MG tablet TK 1 T PO Q NIGHT 12/16/17   [provider]  pantoprazole (PROTONIX) 20 MG tablet Take 20 mg by  mouth daily after breakfast.     [provider]  potassium chloride (K-DUR) 10 MEQ tablet Take 1 tablet (10 mEq total) by mouth daily for 4 days. 08/10/17 12/13/17  Tegeler, Gwenyth Allegra, MD  prazosin (MINIPRESS) 2 MG capsule Take 2 mg by mouth at bedtime.    [provider]  sertraline (ZOLOFT) 100 MG tablet TK 1 T PO QAM 05/22/18   [provider]  traZODone (DESYREL) 100 MG tablet Take 100 mg by mouth at bedtime.     [provider]    Family History Family History  Problem Relation Age of Onset  .  Hypertension Father   . Diabetes Father   . Diabetes Sister   . Diabetes Maternal Grandmother   . Breast cancer Maternal Grandmother   . Cancer Other   . Diabetes Other   . Breast cancer Paternal Grandmother     Social History Social History   Tobacco Use  . Smoking status: Former Research scientist (life sciences)  . Smokeless tobacco: Never Used  Substance Use Topics  . Alcohol use: Yes    Frequency: Never    Comment: occ  . Drug use: No     Allergies   Gabapentin, Lactose intolerance (gi), Metformin and related, and Victoza [liraglutide]   Review of Systems Review of Systems  All systems reviewed and negative, other than as noted in HPI.  Physical Exam Updated Vital Signs BP 135/89 (BP Location: Right Wrist)   Pulse 97   Temp 98.2 F (36.8 C) (Oral)   Resp 18   Ht 5\' 1"  (1.549 m)   Wt 133.8 kg   SpO2 92%   BMI 55.74 kg/m   Physical Exam Vitals signs and nursing note reviewed.  Constitutional:      General: He is not in acute distress.    Appearance: He is well-developed.  HENT:     Head: Normocephalic and atraumatic.  Eyes:     General:        Right eye: No discharge.        Left eye: No discharge.     Conjunctiva/sclera: Conjunctivae normal.  Neck:     Musculoskeletal: Neck supple.  Cardiovascular:     Rate and Rhythm: Normal rate and regular rhythm.     Heart sounds: Normal heart sounds. No murmur. No friction rub. No gallop.   Pulmonary:     Effort: Pulmonary effort is normal. No respiratory distress.     Breath sounds: Normal breath sounds.  Abdominal:     General: There is no distension.     Palpations: Abdomen is soft.     Tenderness: There is no abdominal tenderness.  Musculoskeletal:        General: No tenderness.  Skin:    General: Skin is warm and dry.  Neurological:     Mental Status: He is alert.  Psychiatric:        Behavior: Behavior normal.        Thought Content: Thought content normal.      ED Treatments / Results  Labs (all labs ordered  are listed, but only abnormal results are displayed) Labs Reviewed  URINALYSIS, ROUTINE W REFLEX MICROSCOPIC - Abnormal; Notable for the following components:      Result Value   Specific Gravity, Urine >1.030 (*)    Glucose, UA 250 (*)    All other components within normal limits    EKG    Radiology No results found.  Procedures Procedures (including critical care time)  Medications  Ordered in ED Medications - No data to display   Initial Impression / Assessment and Plan / ED Course  I have reviewed the triage vital signs and the nursing notes.  Pertinent labs & imaging results that were available during my care of the patient were reviewed by me and considered in my medical decision making (see chart for details).        Looks like UTI has cleared.  Reassurance provided.  Return precautions discussed.  Final Clinical Impressions(s) / ED Diagnoses   Final diagnoses:  Concern about urinary tract disease without diagnosis    ED Discharge Orders    None       Virgel Manifold, MD 07/20/18 2150
